# Patient Record
Sex: Female | Born: 1985
Health system: Southern US, Community
[De-identification: ages and names within clinical notes are randomized; demographics above are authoritative.]

## PROBLEM LIST (undated history)

## (undated) DIAGNOSIS — O26649 Intrahepatic cholestasis of pregnancy, unspecified trimester: Secondary | ICD-10-CM

## (undated) DIAGNOSIS — B179 Acute viral hepatitis, unspecified: Secondary | ICD-10-CM

## (undated) DIAGNOSIS — O24419 Gestational diabetes mellitus in pregnancy, unspecified control: Secondary | ICD-10-CM

## (undated) DIAGNOSIS — R945 Abnormal results of liver function studies: Secondary | ICD-10-CM

## (undated) DIAGNOSIS — K831 Obstruction of bile duct: Secondary | ICD-10-CM

## (undated) DIAGNOSIS — S61219A Laceration without foreign body of unspecified finger without damage to nail, initial encounter: Secondary | ICD-10-CM

## (undated) DIAGNOSIS — R7989 Other specified abnormal findings of blood chemistry: Secondary | ICD-10-CM

## (undated) HISTORY — DX: Acute viral hepatitis, unspecified: B17.9

## (undated) HISTORY — DX: Obstruction of bile duct: K83.1

## (undated) HISTORY — DX: Abnormal results of liver function studies: R94.5

## (undated) HISTORY — DX: Gestational diabetes mellitus in pregnancy, unspecified control: O24.419

## (undated) HISTORY — DX: Other specified abnormal findings of blood chemistry: R79.89

## (undated) HISTORY — PX: WISDOM TOOTH EXTRACTION: SHX21

## (undated) HISTORY — DX: Intrahepatic cholestasis of pregnancy, unspecified trimester: O26.649

---
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
## 2014-10-11 ENCOUNTER — Emergency Department (HOSPITAL_COMMUNITY)
Admission: EM | Admit: 2014-10-11 | Discharge: 2014-10-11 | Disposition: A | Discharge status: Home / Self Care | Payer: 59 | Attending: Emergency Medicine | Admitting: Emergency Medicine

## 2014-10-11 ENCOUNTER — Encounter (HOSPITAL_COMMUNITY): Payer: Self-pay | Admitting: Emergency Medicine

## 2014-10-11 DIAGNOSIS — S61214A Laceration without foreign body of right ring finger without damage to nail, initial encounter: Secondary | ICD-10-CM | POA: Insufficient documentation

## 2014-10-11 DIAGNOSIS — S66326A Laceration of extensor muscle, fascia and tendon of right little finger at wrist and hand level, initial encounter: Secondary | ICD-10-CM | POA: Diagnosis not present

## 2014-10-11 DIAGNOSIS — Z23 Encounter for immunization: Secondary | ICD-10-CM | POA: Insufficient documentation

## 2014-10-11 DIAGNOSIS — W260XXA Contact with knife, initial encounter: Secondary | ICD-10-CM | POA: Insufficient documentation

## 2014-10-11 DIAGNOSIS — Y9389 Activity, other specified: Secondary | ICD-10-CM | POA: Diagnosis not present

## 2014-10-11 DIAGNOSIS — S61219A Laceration without foreign body of unspecified finger without damage to nail, initial encounter: Secondary | ICD-10-CM

## 2014-10-11 DIAGNOSIS — Y9289 Other specified places as the place of occurrence of the external cause: Secondary | ICD-10-CM | POA: Insufficient documentation

## 2014-10-11 HISTORY — DX: Laceration without foreign body of unspecified finger without damage to nail, initial encounter: S61.219A

## 2014-10-11 MED ORDER — TETANUS-DIPHTH-ACELL PERTUSSIS 5-2.5-18.5 LF-MCG/0.5 IM SUSP
0.5000 mL | Freq: Once | INTRAMUSCULAR | Status: AC
  Administered 2014-10-11: 0.5 mL via INTRAMUSCULAR
  Filled 2014-10-11: qty 0.5

## 2014-10-11 MED ORDER — LIDOCAINE HCL 2 % IJ SOLN: 10.0000 mL | Freq: Once | INTRAMUSCULAR | Status: DC

## 2014-10-11 MED ORDER — LIDOCAINE HCL 2 % IJ SOLN
20.0000 mL | Freq: Once | INTRAMUSCULAR | Status: AC
  Administered 2014-10-11: 400 mg
  Filled 2014-10-11: qty 20

## 2014-10-11 MED ORDER — CEPHALEXIN 500 MG PO CAPS: 500.0000 mg | ORAL_CAPSULE | Freq: Four times a day (QID) | ORAL | Status: DC

## 2014-10-11 NOTE — ED Provider Notes (Signed)
CSN: [id number]     Arrival date & time 10/11/14  1950 History   First MD Initiated Contact with Patient 10/11/14 2033     Chief Complaint  Patient presents with  . Extremity Laceration     (Consider location/radiation/quality/duration/timing/severity/associated sxs/prior Treatment) The history is provided by the patient.    Right handed patient with laceration to 4th and 5th digits, presented for concern for tendon laceration.  She is a physician, internal medicine.  States she was carving a pumpkin with a kitchen knife when she accidentally cut her fingers.  This was not a stab wound.  No concern for foreign body.  No break in the blade.  She is unable to flex at her DIP on 5th finger, otherwise denies weakness or problems with ROM.  Denies change in sensation.  Denies other injury.   History reviewed. No pertinent past medical history. History reviewed. No pertinent past surgical history. No family history on file. History  Substance Use Topics  . Smoking status: Never Smoker   . Smokeless tobacco: Not on file  . Alcohol Use: No   OB History   Grav Para Term Preterm Abortions TAB SAB Ect Mult Living                 Review of Systems  Skin: Positive for wound. Negative for color change.  Allergic/Immunologic: Negative for immunocompromised state.  Neurological: Positive for weakness. Negative for numbness.  Hematological: Does not bruise/bleed easily.  Psychiatric/Behavioral: Positive for self-injury (accidental).      Allergies  Review of patient's allergies indicates no known allergies.  Home Medications   Prior to Admission medications   Not on File   BP 134/83  Temp(Src) 98.1 F (36.7 C) (Oral)  Resp 16  Ht 5\' 1"  (1.549 m)  Wt 125 lb (56.7 kg)  BMI 23.63 kg/m2  SpO2 100%  LMP 10/04/2014 Physical Exam  Nursing note and vitals reviewed. Constitutional: She appears well-developed and well-nourished. No distress.  HENT:  Head: Normocephalic and  atraumatic.  Neck: Neck supple.  Pulmonary/Chest: Effort normal.  Musculoskeletal:  Laceration to palmar aspect of 4th and 5th fingers over proximal phalanx.  4th finger laceration is superficial.  5th finger laceration is deeper.  Pt unable to flex and DIP, otherwise normal ROM.  Sensation intact. Capillary refill < 2 seconds.    Neurological: She is alert.  Skin: She is not diaphoretic.    ED Course  Procedures (including critical care time) Labs Review Labs Reviewed - No data to display  Imaging Review No results found.   EKG Interpretation None      8:54 PM Discussed pt with Dr Fredna Dow.  Requests wash out, laceration repair, dorsal splint, f/u in his office tomorrow or early next week.   \ LACERATION REPAIR Performed by: Clayton Bibles Authorized by: Clayton Bibles Consent: Verbal consent obtained. Risks and benefits: risks, benefits and alternatives were discussed Consent given by: patient Patient identity confirmed: provided demographic data Prepped and Draped in normal sterile fashion Wound explored  Laceration Location: 5th finger, right hand, palmar aspect  Laceration Length: 1.2cm  No Foreign Bodies seen or palpated  Anesthesia: digital block  Local anesthetic: lidocaine 2% no epinephrine  Anesthetic total: 4 ml  Irrigation method: syringe Amount of cleaning: standard  Skin closure: 5-0 vicryl plus  Number of sutures: 3  Technique: simple interrupted  Patient tolerance: Patient tolerated the procedure well with no immediate complications.   LACERATION REPAIR Performed by: Clayton Bibles Authorized by: Melina Modena,  Nate Perri Consent: Verbal consent obtained. Risks and benefits: risks, benefits and alternatives were discussed Consent given by: patient Patient identity confirmed: provided demographic data Prepped and Draped in normal sterile fashion Wound explored  Laceration Location: 4th finger, right hand, palmar aspect  Laceration Length: 1cm  No Foreign  Bodies seen or palpated  Anesthesia: local infiltration  Local anesthetic: lidocaine 2% no epinephrine  Anesthetic total: 1 ml  Irrigation method: syringe Amount of cleaning: standard  Skin closure: 5-0 vicryl plus  Number of sutures: 2  Technique: simple interrupted.   Patient tolerance: Patient tolerated the procedure well with no immediate complications.   MDM   Final diagnoses:  Laceration of finger with tendon involvement, initial encounter  Finger laceration, initial encounter    Afebrile, nontoxic patient with lacerations to palmar aspect of 4th and 5th fingers, tendon involvement with 5th finger.  Otherwise uncomplicated.  Sensation intact, cap refill is normal.  Discussed with Dr Fredna Dow.  Wound thoroughly cleansed and sutured in ED.  Dorsal splint per Dr Levell July request.    D/C home with keflex, hand follow up.   Discussed result, findings, treatment, and follow up  with patient.  Pt given return precautions.  Pt verbalizes understanding and agrees with plan.         Clayton Bibles, PA-C 10/11/14 2143

## 2014-10-11 NOTE — Discharge Instructions (Signed)
Read the information below.  Use the prescribed medication as directed.  Please discuss all new medications with your pharmacist.  You may return to the Emergency Department at any time for worsening condition or any new symptoms that concern you.  If you develop redness, swelling, pus draining from the wound, or fevers greater than 100.4, return to the ER immediately for a recheck.     Cast or Splint Care Casts and splints support injured limbs and keep bones from moving while they heal. It is important to care for your cast or splint at home.  HOME CARE INSTRUCTIONS  Keep the cast or splint uncovered during the drying period. It can take 24 to 48 hours to dry if it is made of plaster. A fiberglass cast will dry in less than 1 hour.  Do not rest the cast on anything harder than a pillow for the first 24 hours.  Do not put weight on your injured limb or apply pressure to the cast until your health care provider gives you permission.  Keep the cast or splint dry. Wet casts or splints can lose their shape and may not support the limb as well. A wet cast that has lost its shape can also create harmful pressure on your skin when it dries. Also, wet skin can become infected.  Cover the cast or splint with a plastic bag when bathing or when out in the rain or snow. If the cast is on the trunk of the body, take sponge baths until the cast is removed.  If your cast does become wet, dry it with a towel or a blow dryer on the cool setting only.  Keep your cast or splint clean. Soiled casts may be wiped with a moistened cloth.  Do not place any hard or soft foreign objects under your cast or splint, such as cotton, toilet paper, lotion, or powder.  Do not try to scratch the skin under the cast with any object. The object could get stuck inside the cast. Also, scratching could lead to an infection. If itching is a problem, use a blow dryer on a cool setting to relieve discomfort.  Do not trim or cut  your cast or remove padding from inside of it.  Exercise all joints next to the injury that are not immobilized by the cast or splint. For example, if you have a long leg cast, exercise the hip joint and toes. If you have an arm cast or splint, exercise the shoulder, elbow, thumb, and fingers.  Elevate your injured arm or leg on 1 or 2 pillows for the first 1 to 3 days to decrease swelling and pain.It is best if you can comfortably elevate your cast so it is higher than your heart. SEEK MEDICAL CARE IF:   Your cast or splint cracks.  Your cast or splint is too tight or too loose.  You have unbearable itching inside the cast.  Your cast becomes wet or develops a soft spot or area.  You have a bad smell coming from inside your cast.  You get an object stuck under your cast.  Your skin around the cast becomes red or raw.  You have new pain or worsening pain after the cast has been applied. SEEK IMMEDIATE MEDICAL CARE IF:   You have fluid leaking through the cast.  You are unable to move your fingers or toes.  You have discolored (blue or white), cool, painful, or very swollen fingers or toes beyond the  cast.  You have tingling or numbness around the injured area.  You have severe pain or pressure under the cast.  You have any difficulty with your breathing or have shortness of breath.  You have chest pain. Document Released: 11/27/2000 Document Revised: 09/20/2013 Document Reviewed: 06/08/2013 [facility] Patient Information 2015 [place], [place]. This information is not intended to replace advice given to you by your health care provider. Make sure you discuss any questions you have with your health care provider.  Laceration Care, Adult A laceration is a cut or lesion that goes through all layers of the skin and into the tissue just beneath the skin. TREATMENT  Some lacerations may not require closure. Some lacerations may not be able to be closed due to an increased risk of  infection. It is important to see your caregiver as soon as possible after an injury to minimize the risk of infection and maximize the opportunity for successful closure. If closure is appropriate, pain medicines may be given, if needed. The wound will be cleaned to help prevent infection. Your caregiver will use stitches (sutures), staples, wound glue (adhesive), or skin adhesive strips to repair the laceration. These tools bring the skin edges together to allow for faster healing and a better cosmetic outcome. However, all wounds will heal with a scar. Once the wound has healed, scarring can be minimized by covering the wound with sunscreen during the day for 1 full year. HOME CARE INSTRUCTIONS  For sutures or staples:  Keep the wound clean and dry.  If you were given a bandage (dressing), you should change it at least once a day. Also, change the dressing if it becomes wet or dirty, or as directed by your caregiver.  Wash the wound with soap and water 2 times a day. Rinse the wound off with water to remove all soap. Pat the wound dry with a clean towel.  After cleaning, apply a thin layer of the antibiotic ointment as recommended by your caregiver. This will help prevent infection and keep the dressing from sticking.  You may shower as usual after the first 24 hours. Do not soak the wound in water until the sutures are removed.  Only take over-the-counter or prescription medicines for pain, discomfort, or fever as directed by your caregiver.  Get your sutures or staples removed as directed by your caregiver. For skin adhesive strips:  Keep the wound clean and dry.  Do not get the skin adhesive strips wet. You may bathe carefully, using caution to keep the wound dry.  If the wound gets wet, pat it dry with a clean towel.  Skin adhesive strips will fall off on their own. You may trim the strips as the wound heals. Do not remove skin adhesive strips that are still stuck to the wound. They  will fall off in time. For wound adhesive:  You may briefly wet your wound in the shower or bath. Do not soak or scrub the wound. Do not swim. Avoid periods of heavy perspiration until the skin adhesive has fallen off on its own. After showering or bathing, gently pat the wound dry with a clean towel.  Do not apply liquid medicine, cream medicine, or ointment medicine to your wound while the skin adhesive is in place. This may loosen the film before your wound is healed.  If a dressing is placed over the wound, be careful not to apply tape directly over the skin adhesive. This may cause the adhesive to be pulled off  before the wound is healed.  Avoid prolonged exposure to sunlight or tanning lamps while the skin adhesive is in place. Exposure to ultraviolet light in the first year will darken the scar.  The skin adhesive will usually remain in place for 5 to 10 days, then naturally fall off the skin. Do not pick at the adhesive film. You may need a tetanus shot if:  You cannot remember when you had your last tetanus shot.  You have never had a tetanus shot. If you get a tetanus shot, your arm may swell, get red, and feel warm to the touch. This is common and not a problem. If you need a tetanus shot and you choose not to have one, there is a rare chance of getting tetanus. Sickness from tetanus can be serious. SEEK MEDICAL CARE IF:   You have redness, swelling, or increasing pain in the wound.  You see a red line that goes away from the wound.  You have yellowish-white fluid (pus) coming from the wound.  You have a fever.  You notice a bad smell coming from the wound or dressing.  Your wound breaks open before or after sutures have been removed.  You notice something coming out of the wound such as wood or glass.  Your wound is on your hand or foot and you cannot move a finger or toe. SEEK IMMEDIATE MEDICAL CARE IF:   Your pain is not controlled with prescribed medicine.  You  have severe swelling around the wound causing pain and numbness or a change in color in your arm, hand, leg, or foot.  Your wound splits open and starts bleeding.  You have worsening numbness, weakness, or loss of function of any joint around or beyond the wound.  You develop painful lumps near the wound or on the skin anywhere on your body. MAKE SURE YOU:   Understand these instructions.  Will watch your condition.  Will get help right away if you are not doing well or get worse. Document Released: 11/30/2005 Document Revised: 02/22/2012 Document Reviewed: 05/26/2011 [facility] [facility] Patient Information 2015 [place], [place]. This information is not intended to replace advice given to you by your health care provider. Make sure you discuss any questions you have with your health care provider.

## 2014-10-11 NOTE — ED Provider Notes (Signed)
Medical screening examination/treatment/procedure(s) were performed by non-physician practitioner and as supervising physician I was immediately available for consultation/collaboration.   EKG Interpretation None        Artis Delay, MD 10/11/14 2342

## 2014-10-11 NOTE — ED Notes (Signed)
Pt presents with c/o finger laceration. Pt reports she accidentally cut her right ring finger and right pinky finger with a knife. Bleeding controlled at this time.

## 2014-10-12 ENCOUNTER — Encounter (HOSPITAL_BASED_OUTPATIENT_CLINIC_OR_DEPARTMENT_OTHER): Payer: Self-pay | Admitting: *Deleted

## 2014-10-12 ENCOUNTER — Other Ambulatory Visit: Payer: Self-pay | Admitting: Orthopedic Surgery

## 2014-10-15 ENCOUNTER — Ambulatory Visit (HOSPITAL_BASED_OUTPATIENT_CLINIC_OR_DEPARTMENT_OTHER): Payer: 59 | Admitting: Anesthesiology

## 2014-10-15 ENCOUNTER — Ambulatory Visit (HOSPITAL_BASED_OUTPATIENT_CLINIC_OR_DEPARTMENT_OTHER)
Admission: RE | Admit: 2014-10-15 | Discharge: 2014-10-15 | Disposition: A | Discharge status: Home / Self Care | Payer: 59 | Source: Ambulatory Visit | Attending: Orthopedic Surgery | Admitting: Orthopedic Surgery

## 2014-10-15 ENCOUNTER — Encounter (HOSPITAL_BASED_OUTPATIENT_CLINIC_OR_DEPARTMENT_OTHER)
Admission: RE | Disposition: A | Discharge status: Home / Self Care | Payer: Self-pay | Source: Ambulatory Visit | Attending: Orthopedic Surgery

## 2014-10-15 ENCOUNTER — Encounter (HOSPITAL_BASED_OUTPATIENT_CLINIC_OR_DEPARTMENT_OTHER): Payer: Self-pay

## 2014-10-15 DIAGNOSIS — S61216A Laceration without foreign body of right little finger without damage to nail, initial encounter: Secondary | ICD-10-CM | POA: Insufficient documentation

## 2014-10-15 DIAGNOSIS — Y9389 Activity, other specified: Secondary | ICD-10-CM | POA: Diagnosis not present

## 2014-10-15 DIAGNOSIS — Y998 Other external cause status: Secondary | ICD-10-CM | POA: Diagnosis not present

## 2014-10-15 DIAGNOSIS — Y92099 Unspecified place in other non-institutional residence as the place of occurrence of the external cause: Secondary | ICD-10-CM | POA: Insufficient documentation

## 2014-10-15 DIAGNOSIS — W260XXA Contact with knife, initial encounter: Secondary | ICD-10-CM | POA: Diagnosis not present

## 2014-10-15 DIAGNOSIS — S61214A Laceration without foreign body of right ring finger without damage to nail, initial encounter: Secondary | ICD-10-CM | POA: Diagnosis present

## 2014-10-15 DIAGNOSIS — S56127A Laceration of flexor muscle, fascia and tendon of right little finger at forearm level, initial encounter: Secondary | ICD-10-CM | POA: Insufficient documentation

## 2014-10-15 HISTORY — PX: WOUND EXPLORATION: SHX6188

## 2014-10-15 HISTORY — PX: TENDON REPAIR: SHX5111

## 2014-10-15 HISTORY — DX: Laceration without foreign body of unspecified finger without damage to nail, initial encounter: S61.219A

## 2014-10-15 LAB — POCT HEMOGLOBIN-HEMACUE: HEMOGLOBIN: 14.9 g/dL (ref 12.0–15.0)

## 2014-10-15 SURGERY — WOUND EXPLORATION
Anesthesia: General | Site: Finger | Laterality: Right

## 2014-10-15 MED ORDER — LIDOCAINE HCL (CARDIAC) 20 MG/ML IV SOLN
INTRAVENOUS | Status: DC | PRN
  Administered 2014-10-15: 60 mg via INTRAVENOUS

## 2014-10-15 MED ORDER — DEXAMETHASONE SODIUM PHOSPHATE 10 MG/ML IJ SOLN
INTRAMUSCULAR | Status: DC | PRN
  Administered 2014-10-15: 10 mg via INTRAVENOUS

## 2014-10-15 MED ORDER — SCOPOLAMINE 1 MG/3DAYS TD PT72
1.0000 | MEDICATED_PATCH | TRANSDERMAL | Status: DC
  Administered 2014-10-15: 1.5 mg via TRANSDERMAL

## 2014-10-15 MED ORDER — OXYCODONE HCL 5 MG/5ML PO SOLN: 5.0000 mg | Freq: Once | ORAL | Status: DC | PRN

## 2014-10-15 MED ORDER — FENTANYL CITRATE 0.05 MG/ML IJ SOLN
INTRAMUSCULAR | Status: AC
Start: 2014-10-15 — End: 2014-10-15
  Filled 2014-10-15: qty 6

## 2014-10-15 MED ORDER — BUPIVACAINE HCL (PF) 0.25 % IJ SOLN
INTRAMUSCULAR | Status: DC | PRN
  Administered 2014-10-15: 8 mL

## 2014-10-15 MED ORDER — CHLORHEXIDINE GLUCONATE 4 % EX LIQD: 60.0000 mL | Freq: Once | CUTANEOUS | Status: DC

## 2014-10-15 MED ORDER — ONDANSETRON HCL 4 MG/2ML IJ SOLN: 4.0000 mg | Freq: Once | INTRAMUSCULAR | Status: DC | PRN

## 2014-10-15 MED ORDER — FENTANYL CITRATE 0.05 MG/ML IJ SOLN: 50.0000 ug | INTRAMUSCULAR | Status: DC | PRN

## 2014-10-15 MED ORDER — BUPIVACAINE HCL (PF) 0.25 % IJ SOLN
INTRAMUSCULAR | Status: AC
  Filled 2014-10-15: qty 30

## 2014-10-15 MED ORDER — MIDAZOLAM HCL 2 MG/2ML IJ SOLN
INTRAMUSCULAR | Status: AC
  Filled 2014-10-15: qty 2

## 2014-10-15 MED ORDER — EPHEDRINE SULFATE 50 MG/ML IJ SOLN
INTRAMUSCULAR | Status: DC | PRN
  Administered 2014-10-15: 10 mg via INTRAVENOUS

## 2014-10-15 MED ORDER — LACTATED RINGERS IV SOLN
INTRAVENOUS | Status: DC
  Administered 2014-10-15 (×2): via INTRAVENOUS

## 2014-10-15 MED ORDER — SUCCINYLCHOLINE CHLORIDE 20 MG/ML IJ SOLN
INTRAMUSCULAR | Status: AC
  Filled 2014-10-15: qty 1

## 2014-10-15 MED ORDER — MIDAZOLAM HCL 5 MG/5ML IJ SOLN
INTRAMUSCULAR | Status: DC | PRN
  Administered 2014-10-15: 2 mg via INTRAVENOUS

## 2014-10-15 MED ORDER — SCOPOLAMINE 1 MG/3DAYS TD PT72
MEDICATED_PATCH | TRANSDERMAL | Status: AC
  Filled 2014-10-15: qty 1

## 2014-10-15 MED ORDER — PROPOFOL 10 MG/ML IV EMUL
INTRAVENOUS | Status: AC
  Filled 2014-10-15: qty 50

## 2014-10-15 MED ORDER — MIDAZOLAM HCL 2 MG/ML PO SYRP: 12.0000 mg | ORAL_SOLUTION | Freq: Once | ORAL | Status: DC | PRN

## 2014-10-15 MED ORDER — CEFAZOLIN SODIUM-DEXTROSE 2-3 GM-% IV SOLR
2.0000 g | INTRAVENOUS | Status: AC
  Administered 2014-10-15: 2 g via INTRAVENOUS

## 2014-10-15 MED ORDER — ONDANSETRON HCL 4 MG/2ML IJ SOLN
INTRAMUSCULAR | Status: DC | PRN
  Administered 2014-10-15: 4 mg via INTRAVENOUS

## 2014-10-15 MED ORDER — OXYCODONE HCL 5 MG PO TABS
5.0000 mg | ORAL_TABLET | Freq: Once | ORAL | Status: DC | PRN
Start: 2014-10-15 — End: 2014-10-15

## 2014-10-15 MED ORDER — HYDROCODONE-ACETAMINOPHEN 5-325 MG PO TABS: ORAL_TABLET | ORAL | Status: DC

## 2014-10-15 MED ORDER — PROPOFOL 10 MG/ML IV BOLUS
INTRAVENOUS | Status: DC | PRN
  Administered 2014-10-15: 150 mg via INTRAVENOUS

## 2014-10-15 MED ORDER — FENTANYL CITRATE 0.05 MG/ML IJ SOLN
INTRAMUSCULAR | Status: DC | PRN
  Administered 2014-10-15: 50 ug via INTRAVENOUS
  Administered 2014-10-15: 25 ug via INTRAVENOUS
  Administered 2014-10-15: 50 ug via INTRAVENOUS
  Administered 2014-10-15: 25 ug via INTRAVENOUS

## 2014-10-15 MED ORDER — HYDROMORPHONE HCL 1 MG/ML IJ SOLN: 0.2500 mg | INTRAMUSCULAR | Status: DC | PRN

## 2014-10-15 MED ORDER — CEFAZOLIN SODIUM-DEXTROSE 2-3 GM-% IV SOLR
INTRAVENOUS | Status: AC
Start: 2014-10-15 — End: 2014-10-15
  Filled 2014-10-15: qty 50

## 2014-10-15 MED ORDER — MIDAZOLAM HCL 2 MG/2ML IJ SOLN: 1.0000 mg | INTRAMUSCULAR | Status: DC | PRN

## 2014-10-15 SURGICAL SUPPLY — 95 items
BAG DECANTER FOR FLEXI CONT (MISCELLANEOUS) IMPLANT
BANDAGE ELASTIC 3 VELCRO ST LF (GAUZE/BANDAGES/DRESSINGS) ×3 IMPLANT
BLADE MINI RND TIP GREEN BEAV (BLADE) IMPLANT
BLADE SURG 15 STRL LF DISP TIS (BLADE) ×2 IMPLANT
BLADE SURG 15 STRL SS (BLADE) ×4
BNDG CONFORM 2 STRL LF (GAUZE/BANDAGES/DRESSINGS) IMPLANT
BNDG ELASTIC 2 VLCR STRL LF (GAUZE/BANDAGES/DRESSINGS) IMPLANT
BNDG ESMARK 4X9 LF (GAUZE/BANDAGES/DRESSINGS) ×3 IMPLANT
BNDG GAUZE ELAST 4 BULKY (GAUZE/BANDAGES/DRESSINGS) ×3 IMPLANT
CHLORAPREP W/TINT 26ML (MISCELLANEOUS) ×3 IMPLANT
CORDS BIPOLAR (ELECTRODE) ×3 IMPLANT
COTTONBALL LRG STERILE PKG (GAUZE/BANDAGES/DRESSINGS) IMPLANT
COVER BACK TABLE 60X90IN (DRAPES) ×3 IMPLANT
COVER MAYO STAND STRL (DRAPES) ×3 IMPLANT
CUFF TOURNIQUET SINGLE 18IN (TOURNIQUET CUFF) ×3 IMPLANT
DECANTER SPIKE VIAL GLASS SM (MISCELLANEOUS) IMPLANT
DRAIN TLS ROUND 10FR (DRAIN) IMPLANT
DRAPE EXTREMITY T 121X128X90 (DRAPE) ×3 IMPLANT
DRAPE OEC MINIVIEW 54X84 (DRAPES) IMPLANT
DRAPE SURG 17X23 STRL (DRAPES) ×3 IMPLANT
DRSG PAD ABDOMINAL 8X10 ST (GAUZE/BANDAGES/DRESSINGS) IMPLANT
GAUZE SPONGE 4X4 12PLY STRL (GAUZE/BANDAGES/DRESSINGS) ×3 IMPLANT
GAUZE SPONGE 4X4 16PLY XRAY LF (GAUZE/BANDAGES/DRESSINGS) IMPLANT
GAUZE XEROFORM 1X8 LF (GAUZE/BANDAGES/DRESSINGS) ×3 IMPLANT
GLOVE BIO SURGEON STRL SZ 6.5 (GLOVE) ×2 IMPLANT
GLOVE BIO SURGEON STRL SZ7.5 (GLOVE) ×3 IMPLANT
GLOVE BIO SURGEONS STRL SZ 6.5 (GLOVE) ×1
GLOVE BIOGEL PI IND STRL 7.0 (GLOVE) ×1 IMPLANT
GLOVE BIOGEL PI IND STRL 8 (GLOVE) ×1 IMPLANT
GLOVE BIOGEL PI IND STRL 8.5 (GLOVE) ×1 IMPLANT
GLOVE BIOGEL PI INDICATOR 7.0 (GLOVE) ×2
GLOVE BIOGEL PI INDICATOR 8 (GLOVE) ×2
GLOVE BIOGEL PI INDICATOR 8.5 (GLOVE) ×2
GLOVE SURG ORTHO 8.0 STRL STRW (GLOVE) ×3 IMPLANT
GOWN STRL REUS W/ TWL LRG LVL3 (GOWN DISPOSABLE) ×1 IMPLANT
GOWN STRL REUS W/ TWL XL LVL3 (GOWN DISPOSABLE) IMPLANT
GOWN STRL REUS W/TWL LRG LVL3 (GOWN DISPOSABLE) ×2
GOWN STRL REUS W/TWL XL LVL3 (GOWN DISPOSABLE) ×6 IMPLANT
K-WIRE .035X4 (WIRE) IMPLANT
LOOP VESSEL MAXI BLUE (MISCELLANEOUS) IMPLANT
NDL SAFETY ECLIPSE 18X1.5 (NEEDLE) IMPLANT
NEEDLE HYPO 18GX1.5 SHARP (NEEDLE)
NEEDLE HYPO 22GX1.5 SAFETY (NEEDLE) IMPLANT
NEEDLE HYPO 25X1 1.5 SAFETY (NEEDLE) ×3 IMPLANT
NEEDLE KEITH (NEEDLE) IMPLANT
NS IRRIG 1000ML POUR BTL (IV SOLUTION) ×3 IMPLANT
PACK BASIN DAY SURGERY FS (CUSTOM PROCEDURE TRAY) ×3 IMPLANT
PAD CAST 3X4 CTTN HI CHSV (CAST SUPPLIES) IMPLANT
PAD CAST 4YDX4 CTTN HI CHSV (CAST SUPPLIES) ×1 IMPLANT
PADDING CAST ABS 3INX4YD NS (CAST SUPPLIES)
PADDING CAST ABS 4INX4YD NS (CAST SUPPLIES) ×2
PADDING CAST ABS COTTON 3X4 (CAST SUPPLIES) IMPLANT
PADDING CAST ABS COTTON 4X4 ST (CAST SUPPLIES) ×1 IMPLANT
PADDING CAST COTTON 3X4 STRL (CAST SUPPLIES)
PADDING CAST COTTON 4X4 STRL (CAST SUPPLIES) ×2
SLEEVE SCD COMPRESS KNEE MED (MISCELLANEOUS) IMPLANT
SPEAR EYE SURG WECK-CEL (MISCELLANEOUS) ×3 IMPLANT
SPLINT PLASTER CAST XFAST 3X15 (CAST SUPPLIES) ×14 IMPLANT
SPLINT PLASTER CAST XFAST 4X15 (CAST SUPPLIES) IMPLANT
SPLINT PLASTER XTRA FAST SET 4 (CAST SUPPLIES)
SPLINT PLASTER XTRA FASTSET 3X (CAST SUPPLIES) ×28
STOCKINETTE 4X48 STRL (DRAPES) ×3 IMPLANT
SUT CHROMIC 5 0 P 3 (SUTURE) IMPLANT
SUT ETHIBOND 3-0 V-5 (SUTURE) IMPLANT
SUT ETHILON 3 0 PS 1 (SUTURE) IMPLANT
SUT ETHILON 4 0 PS 2 18 (SUTURE) IMPLANT
SUT FIBERWIRE 3-0 18 TAPR NDL (SUTURE)
SUT FIBERWIRE 4-0 18 DIAM BLUE (SUTURE)
SUT FIBERWIRE 4-0 18 TAPR NDL (SUTURE) ×3
SUT MERSILENE 2.0 SH NDLE (SUTURE) IMPLANT
SUT MERSILENE 3 0 FS 1 (SUTURE) IMPLANT
SUT MERSILENE 4 0 P 3 (SUTURE) IMPLANT
SUT MERSILENE 6 0 P 1 (SUTURE) IMPLANT
SUT MON AB 5-0 PS2 18 (SUTURE) ×3 IMPLANT
SUT NYLON 9 0 VRM6 (SUTURE) IMPLANT
SUT POLY BUTTON 15MM (SUTURE) IMPLANT
SUT PROLENE 2 0 SH DA (SUTURE) IMPLANT
SUT PROLENE 6 0 P 1 18 (SUTURE) ×3 IMPLANT
SUT SILK 2 0 FS (SUTURE) IMPLANT
SUT SILK 4 0 PS 2 (SUTURE) ×3 IMPLANT
SUT STEEL 4 0 V 26 (SUTURE) IMPLANT
SUT SUPRAMID 4-0 (SUTURE) IMPLANT
SUT VIC AB 3-0 PS1 18 (SUTURE)
SUT VIC AB 3-0 PS1 18XBRD (SUTURE) IMPLANT
SUT VIC AB 4-0 P-3 18XBRD (SUTURE) IMPLANT
SUT VIC AB 4-0 P3 18 (SUTURE)
SUT VICRYL 4-0 PS2 18IN ABS (SUTURE) IMPLANT
SUTURE FIBERWR 3-0 18 TAPR NDL (SUTURE) IMPLANT
SUTURE FIBERWR 4-0 18 DIA BLUE (SUTURE) IMPLANT
SUTURE FIBERWR 4-0 18 TAPR NDL (SUTURE) ×1 IMPLANT
SYR BULB 3OZ (MISCELLANEOUS) ×3 IMPLANT
SYR CONTROL 10ML LL (SYRINGE) ×3 IMPLANT
TOWEL OR 17X24 6PK STRL BLUE (TOWEL DISPOSABLE) ×6 IMPLANT
TUBE FEEDING 5FR 15 INCH (TUBING) IMPLANT
UNDERPAD 30X30 INCONTINENT (UNDERPADS AND DIAPERS) ×3 IMPLANT

## 2014-10-15 NOTE — Discharge Instructions (Addendum)

## 2014-10-15 NOTE — Anesthesia Procedure Notes (Signed)
Procedure Name: LMA Insertion Performed by: Female Minish Pre-anesthesia Checklist: Patient identified, Emergency Drugs available, Suction available and Patient being monitored Patient Re-evaluated:Patient Re-evaluated prior to inductionOxygen Delivery Method: Circle System Utilized Preoxygenation: Pre-oxygenation with 100% oxygen Intubation Type: IV induction Ventilation: Mask ventilation without difficulty LMA: LMA inserted LMA Size: 4.0 Number of attempts: 1 Airway Equipment and Method: bite block Placement Confirmation: positive ETCO2 Tube secured with: Tape Dental Injury: Teeth and Oropharynx as per pre-operative assessment

## 2014-10-15 NOTE — Op Note (Signed)
[id number] 

## 2014-10-15 NOTE — Anesthesia Postprocedure Evaluation (Signed)
  Anesthesia Post-op Note  Patient: Kathy Blackwell  Procedure(s) Performed: Procedure(s): RIGHT RING WOUND EXPLORATION (Right) RIGHT SMALL TENDON REPAIR (Right)  Patient Location: PACU  Anesthesia Type: General   Level of Consciousness: awake, alert  and oriented  Airway and Oxygen Therapy: Patient Spontanous Breathing  Post-op Pain: mild  Post-op Assessment: Post-op Vital signs reviewed  Post-op Vital Signs: Reviewed  Last Vitals:  Filed Vitals:   10/15/14 1430  BP: 120/80  Pulse: 95  Temp:   Resp: 15    Complications: No apparent anesthesia complications

## 2014-10-15 NOTE — Anesthesia Preprocedure Evaluation (Signed)
Anesthesia Evaluation  Patient identified by MRN, date of birth, ID band Patient awake    Reviewed: Allergy & Precautions, H&P , NPO status , Patient's Chart, lab work & pertinent test results  Airway Mallampati: I  TM Distance: >3 FB Neck ROM: Full    Dental  (+) Teeth Intact, Dental Advisory Given   Pulmonary  breath sounds clear to auscultation        Cardiovascular Rhythm:Regular Rate:Normal     Neuro/Psych    GI/Hepatic   Endo/Other    Renal/GU      Musculoskeletal   Abdominal   Peds  Hematology   Anesthesia Other Findings   Reproductive/Obstetrics                             Anesthesia Physical Anesthesia Plan  ASA: I  Anesthesia Plan: General   Post-op Pain Management:    Induction: Intravenous  Airway Management Planned:   Additional Equipment:   Intra-op Plan:   Post-operative Plan: Extubation in OR  Informed Consent: I have reviewed the patients History and Physical, chart, labs and discussed the procedure including the risks, benefits and alternatives for the proposed anesthesia with the patient or authorized representative who has indicated his/her understanding and acceptance.   Dental advisory given  Plan Discussed with: CRNA, Anesthesiologist and Surgeon  Anesthesia Plan Comments:         Anesthesia Quick Evaluation

## 2014-10-15 NOTE — Transfer of Care (Signed)
Immediate Anesthesia Transfer of Care Note  Patient: Kathy Blackwell  Procedure(s) Performed: Procedure(s): RIGHT RING WOUND EXPLORATION (Right) RIGHT SMALL TENDON REPAIR (Right)  Patient Location: PACU  Anesthesia Type:General  Level of Consciousness: awake, alert , oriented and patient cooperative  Airway & Oxygen Therapy: Patient Spontanous Breathing and Patient connected to face mask oxygen  Post-op Assessment: Report given to PACU RN and Post -op Vital signs reviewed and stable  Post vital signs: Reviewed and stable  Complications: No apparent anesthesia complications

## 2014-10-15 NOTE — H&P (Signed)
  Kathy Blackwell is an 28 y.o. female.   Chief Complaint: right ring/small finger lacerations HPI: 28 yo rhd female states she was carving a pumpkin 10/11/14 when knife slipped causing laceration to right ring and small fingers.  Seen at [facility] where wounds I&D'd and sutured.  Reports no previous injury to digits and no other injury at this time.  Past Medical History  Diagnosis Date  . Finger laceration involving tendon 10/11/2014    right ring and small fingers    Past Surgical History  Procedure Laterality Date  . Tonsillectomy and adenoidectomy      History reviewed. No pertinent family history. Social History:  reports that she has never smoked. She has never used smokeless tobacco. She reports that she drinks alcohol. She reports that she does not use illicit drugs.  Allergies: No Known Allergies  Medications Prior to Admission  Medication Sig Dispense Refill  . cephALEXin (KEFLEX) 500 MG capsule Take 1 capsule (500 mg total) by mouth 4 (four) times daily. 20 capsule 0    Results for orders placed or performed during the hospital encounter of 10/15/14 (from the past 48 hour(s))  Hemoglobin-hemacue, POC     Status: None   Collection Time: 10/15/14 11:12 AM  Result Value Ref Range   Hemoglobin 14.9 12.0 - 15.0 g/dL    No results found.   A comprehensive review of systems was negative.  Blood pressure 127/78, pulse 83, temperature 98.1 F (36.7 C), temperature source Oral, resp. rate 16, height 5\' 1"  (1.549 m), weight 57.267 kg (126 lb 4 oz), last menstrual period 10/04/2014, SpO2 100 %.  General appearance: alert, cooperative and appears stated age Head: Normocephalic, without obvious abnormality, atraumatic Neck: supple, symmetrical, trachea midline Resp: clear to auscultation bilaterally Cardio: regular rate and rhythm GI: non tender Extremities: intact sensation and capillary refill all digits.  +epl/fpl/io.  lacerations at base of right ring and small fingers.   unable to flex dip of small finger.  intact flexion of ring. Pulses: 2+ and symmetric Skin: Skin color, texture, turgor normal. No rashes or lesions Neurologic: Grossly normal Incision/Wound: As above  Assessment/Plan Right small and ring finger lacerations.  Non operative and operative treatment options were discussed with the patient and patient wishes to proceed with operative treatment. Recommend operative exploration with repair tendon/artery/nerve as necessary.  Risks, benefits, and alternatives of surgery were discussed and the patient agrees with the plan of care.   Kyheem Bathgate R 10/15/2014, 12:07 PM

## 2014-10-15 NOTE — Brief Op Note (Signed)
10/15/2014  2:06 PM  PATIENT:  Kathy Blackwell  28 y.o. female  PRE-OPERATIVE DIAGNOSIS:  right ring and small lacerations possible tendon  POST-OPERATIVE DIAGNOSIS:  Right Ring and Small Lacerations Hand  PROCEDURE:  Procedure(s): RIGHT RING WOUND EXPLORATION (Right) RIGHT SMALL TENDON REPAIR (Right)  SURGEON:  Surgeon(s) and Role:    * Leanora Cover, MD - Primary  PHYSICIAN ASSISTANT:   ASSISTANTS: Daryll Brod, MD   ANESTHESIA:   general  EBL:  Total I/O In: 1000 [I.V.:1000] Out: -   BLOOD ADMINISTERED:none  DRAINS: none   LOCAL MEDICATIONS USED:  MARCAINE     SPECIMEN:  No Specimen  DISPOSITION OF SPECIMEN:  N/A  COUNTS:  YES  TOURNIQUET:   Total Tourniquet Time Documented: Upper Arm (Right) - 71 minutes Total: Upper Arm (Right) - 71 minutes   DICTATION: .Other Dictation: Dictation Number [id number]  PLAN OF CARE: Discharge to home after PACU  PATIENT DISPOSITION:  PACU - hemodynamically stable.

## 2014-10-16 ENCOUNTER — Encounter (HOSPITAL_BASED_OUTPATIENT_CLINIC_OR_DEPARTMENT_OTHER): Payer: Self-pay | Admitting: Orthopedic Surgery

## 2014-10-16 NOTE — Op Note (Addendum)
NAME:  Kathy Blackwell, Kathy Blackwell NO.:  [id number]  MEDICAL RECORD NO.:  [id number]  LOCATION:                               FACILITY:  [facility]  PHYSICIAN:  Leanora Cover, MD        DATE OF BIRTH:  [birth year]  DATE OF PROCEDURE:  10/15/2014 DATE OF DISCHARGE:  10/15/2014                              OPERATIVE REPORT   PREOPERATIVE DIAGNOSIS:  Right ring and small finger lacerations as well as small finger tendon laceration.  POSTOPERATIVE DIAGNOSIS:  Right ring and small finger lacerations as well as small finger tendon laceration.  PROCEDURE:   1. Right ring finger exploration of wound 2. Right small finger repair of flexor digitorum profundus tendon in zone 2.  SURGEON:  Leanora Cover, MD  ASSISTANT:  Daryll Brod, M.D.  ANESTHESIA:  General.  IV FLUIDS:  Per anesthesia flow sheet.  ESTIMATED BLOOD LOSS:  Minimal.  COMPLICATIONS:  None.  SPECIMENS:  None.  TOURNIQUET TIME:  71 minutes.  DISPOSITION:  Stable to PACU.  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  She was seen at [facility] where the wounds were irrigated, debrided and sutured.  She was followed up in the office.  She was unable to flex the DIP joint of the small finger.  She had intact sensation and capillary refill.  Recommended going to the operating room for exploration of the wounds; repair of tendon, artery, and nerve as necessary.  Risks, benefits and alternatives of the surgery were discussed including the risk of blood loss; infection; damage to nerves, vessels, tendons, ligaments, bone; failure of surgery; need for additional surgery; complications with wound healing; continued pain and stiffness.  She voiced understanding of these risks and elected to proceed.  OPERATIVE COURSE:  After being identified preoperatively by myself, the patient and I agreed upon  the procedure and site of procedure.  The surgical site was marked.  The risks, benefits, and alternatives of the surgery were reviewed and she wished to proceed.  Surgical consent had been signed.  She was given IV Ancef as preoperative antibiotic prophylaxis.  She was transferred to the operating room and placed on the operating room table in supine position with the right upper extremity on an armboard.  General anesthesia was induced by Anesthesiology.  Right upper extremity was prepped and draped in normal sterile orthopedic fashion.  Surgical pause was performed between the surgeons, anesthesia, and operating room staff, and all were in agreement as to the patient, procedure, site of procedure.  Tourniquet at the proximal aspect of the extremity was inflated to 250 mmHg after exsanguination of the limb with an Esmarch bandage.  The ring finger was addressed first.  An incision was extended proximally.  This was carried into subcutaneous tissues by spreading technique.  The radial and ulnar neurovascular bundles were identified and were intact.  The tendon sheath was not violated.  There was no blood within the sheath.  The tendons appeared intact.  The wound was copiously irrigated with sterile saline and closed with 5-0 Monocryl in a horizontal mattress fashion. The  small finger was addressed.  The incision was extended both proximally and distally in a Brunner-type fashion.  It was carried into subcutaneous tissues by spreading technique.  Bipolar electrocautery was used to obtain hemostasis.  The radial and ulnar neurovascular bundles were identified and were intact.  There was laceration of the FDP tendon.  This was just proximal to the A4 pulley.  The area of the laceration in the sheath was at the proximal phalanx.  The wound was copiously irrigated with sterile saline.  The proximal stump of tendon was able to be milked back through the sheath up to the small rent that was  made proximal to the A4 pulley.  A 1-2 mm of the proximal aspect of the A4 pulley was split to aid in repair and for the repair of the tendon.  The tendon was repaired with a 6-0 Prolene suture as a running epitendinous suture and a 4-0 FiberWire as a core suture placed in a modified Kessler fashion.  Two-strand repair was achieved.  There was no further space available for additional strands.  The wound was again copiously irrigated with sterile saline.  The tendon ends were approximated well.  Skin was closed with 5-0 Monocryl in a horizontal mattress fashion.  Digital block to the ring and small fingers was performed with 8 mL of 0.25% plain Marcaine to aid in postoperative analgesia.  The wounds were then dressed with sterile Xeroform, 4x4s, and wrapped with a Kerlix bandage.  Dorsal blocking splint with the wrist and MPs flexed was placed and wrapped with Kerlix and Ace bandage. Tourniquet was deflated at 71 minutes.  Fingertips were pink with brisk capillary refill after deflation of the tourniquet.  Operative drapes were broken down.  The patient was awoken from anesthesia safely.  She was transferred back to the stretcher and taken to PACU in stable condition.  I will see her back in the office in 1 week for postoperative followup.  I will give her Norco 5/325, 1-2 p.o. q.6 hours p.r.n. pain, dispense #40.     Leanora Cover, MD     KK/MEDQ  D:  10/15/2014  T:  10/16/2014  Job:  [id number]  10/22/14: Edit for accuracy of procedure.

## 2015-01-09 ENCOUNTER — Other Ambulatory Visit (HOSPITAL_COMMUNITY): Payer: Self-pay | Admitting: Internal Medicine

## 2015-01-09 DIAGNOSIS — R1011 Right upper quadrant pain: Secondary | ICD-10-CM

## 2015-01-23 ENCOUNTER — Ambulatory Visit (HOSPITAL_COMMUNITY)
Admission: RE | Admit: 2015-01-23 | Discharge: 2015-01-23 | Disposition: A | Discharge status: Home / Self Care | Payer: 59 | Source: Ambulatory Visit | Attending: Internal Medicine | Admitting: Internal Medicine

## 2015-01-23 DIAGNOSIS — R1011 Right upper quadrant pain: Secondary | ICD-10-CM | POA: Diagnosis present

## 2015-01-23 DIAGNOSIS — K8020 Calculus of gallbladder without cholecystitis without obstruction: Secondary | ICD-10-CM | POA: Diagnosis not present

## 2015-02-13 ENCOUNTER — Other Ambulatory Visit (HOSPITAL_COMMUNITY): Payer: Self-pay | Admitting: Gynecology

## 2015-02-13 DIAGNOSIS — Z3141 Encounter for fertility testing: Secondary | ICD-10-CM

## 2015-02-19 ENCOUNTER — Ambulatory Visit (HOSPITAL_COMMUNITY)
Admission: RE | Admit: 2015-02-19 | Discharge: 2015-02-19 | Disposition: A | Discharge status: Home / Self Care | Payer: 59 | Source: Ambulatory Visit | Attending: Gynecology | Admitting: Gynecology

## 2015-02-19 ENCOUNTER — Encounter (INDEPENDENT_AMBULATORY_CARE_PROVIDER_SITE_OTHER): Payer: Self-pay

## 2015-02-19 DIAGNOSIS — Z3141 Encounter for fertility testing: Secondary | ICD-10-CM | POA: Insufficient documentation

## 2015-02-19 MED ORDER — IOHEXOL 300 MG/ML  SOLN
30.0000 mL | Freq: Once | INTRAMUSCULAR | Status: AC | PRN
  Administered 2015-02-19: 30 mL

## 2015-07-15 DIAGNOSIS — B179 Acute viral hepatitis, unspecified: Secondary | ICD-10-CM

## 2015-07-15 HISTORY — DX: Acute viral hepatitis, unspecified: B17.9

## 2015-07-18 ENCOUNTER — Other Ambulatory Visit: Payer: Self-pay

## 2015-07-18 ENCOUNTER — Telehealth: Payer: Self-pay | Admitting: Gastroenterology

## 2015-07-18 DIAGNOSIS — K838 Other specified diseases of biliary tract: Secondary | ICD-10-CM

## 2015-07-18 DIAGNOSIS — K8020 Calculus of gallbladder without cholecystitis without obstruction: Secondary | ICD-10-CM

## 2015-07-18 DIAGNOSIS — K831 Obstruction of bile duct: Secondary | ICD-10-CM

## 2015-07-18 NOTE — Telephone Encounter (Signed)
Dr Jacobs please review  

## 2015-07-18 NOTE — Telephone Encounter (Signed)
Dr Bufford Buttner called and asked that if he was not specifically asked for this should go to the doc of the day.  I called and there is no preference in docs so I will send to the doc of the day.

## 2015-07-19 ENCOUNTER — Ambulatory Visit (HOSPITAL_COMMUNITY): Admit: 2015-07-19 | Payer: Self-pay | Admitting: Gastroenterology

## 2015-07-19 ENCOUNTER — Other Ambulatory Visit: Payer: Self-pay

## 2015-07-19 ENCOUNTER — Encounter: Payer: Self-pay | Admitting: Gastroenterology

## 2015-07-19 ENCOUNTER — Other Ambulatory Visit (INDEPENDENT_AMBULATORY_CARE_PROVIDER_SITE_OTHER): Payer: 59

## 2015-07-19 ENCOUNTER — Ambulatory Visit (HOSPITAL_COMMUNITY): Admission: RE | Admit: 2015-07-19 | Payer: 59 | Source: Ambulatory Visit

## 2015-07-19 ENCOUNTER — Encounter (HOSPITAL_COMMUNITY): Payer: Self-pay

## 2015-07-19 ENCOUNTER — Ambulatory Visit (INDEPENDENT_AMBULATORY_CARE_PROVIDER_SITE_OTHER): Payer: 59 | Admitting: Gastroenterology

## 2015-07-19 ENCOUNTER — Ambulatory Visit (INDEPENDENT_AMBULATORY_CARE_PROVIDER_SITE_OTHER): Payer: 59

## 2015-07-19 VITALS — BP 122/82 | HR 88 | Ht 62.99 in | Wt 120.0 lb

## 2015-07-19 DIAGNOSIS — K8020 Calculus of gallbladder without cholecystitis without obstruction: Secondary | ICD-10-CM

## 2015-07-19 DIAGNOSIS — B179 Acute viral hepatitis, unspecified: Secondary | ICD-10-CM | POA: Insufficient documentation

## 2015-07-19 DIAGNOSIS — K7200 Acute and subacute hepatic failure without coma: Secondary | ICD-10-CM

## 2015-07-19 DIAGNOSIS — K838 Other specified diseases of biliary tract: Secondary | ICD-10-CM

## 2015-07-19 DIAGNOSIS — K831 Obstruction of bile duct: Secondary | ICD-10-CM

## 2015-07-19 HISTORY — DX: Calculus of gallbladder without cholecystitis without obstruction: K80.20

## 2015-07-19 LAB — HEPATIC FUNCTION PANEL
ALT: 2984 U/L — AB (ref 0–35)
AST: 763 U/L — ABNORMAL HIGH (ref 0–37)
Albumin: 4.1 g/dL (ref 3.5–5.2)
Alkaline Phosphatase: 59 U/L (ref 39–117)
BILIRUBIN TOTAL: 6.8 mg/dL — AB (ref 0.2–1.2)
Bilirubin, Direct: 4.7 mg/dL — ABNORMAL HIGH (ref 0.0–0.3)
TOTAL PROTEIN: 7.1 g/dL (ref 6.0–8.3)

## 2015-07-19 SURGERY — ENDOSCOPIC RETROGRADE CHOLANGIOPANCREATOGRAPHY (ERCP) WITH PROPOFOL
Anesthesia: General

## 2015-07-19 NOTE — Progress Notes (Signed)
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  Approximately one week ago she developed mild scleral icterus.  She is complaining of fatigue and mild lassitude.  She has known gallbladder stones determined by previous ultrasound.  She complains of mild right upper quadrant discomfort.  Today total bilirubin was 6.8, alkaline phosphatase 59, ALT 2984 and AST 763.  2 days ago ALT was 1275 and AST 879, total bilirubin 5.2 and alkaline phosphatase 65.  There is no history of hepatitis, jaundice, IV drug use, transfusions or heavy alcohol use.  Until 4 days ago she was taking a new oral contraceptive.  She has lost about 4 pounds over the past week.  She is without fever, joint pains or skin rash.  She has mild pruritus.  Abdominal ultrasound today, which I reviewed, demonstrated all bladder stones.  There was no gallbladder wall thickening.  Common bile duct measured 1.8 mm.   Past Medical History  Diagnosis Date  . Finger laceration involving tendon 10/11/2014    right ring and small fingers   Past Surgical History  Procedure Laterality Date  . Tonsillectomy and adenoidectomy    . Wound exploration Right 10/15/2014    Procedure: RIGHT RING WOUND EXPLORATION;  Surgeon: Leanora Cover, MD;  Location: [facility];  Service: Orthopedics;  Laterality: Right;  . Tendon repair Right 10/15/2014    Procedure: RIGHT SMALL TENDON REPAIR;  Surgeon: Leanora Cover, MD;  Location: [place];  Service: Orthopedics;  Laterality: Right;   family history is not on file. Current Outpatient Prescriptions  Medication Sig Dispense Refill  . Multiple Vitamins-Minerals (MULTIVITAMIN WITH MINERALS) tablet Take 1 tablet by mouth daily.     No current facility-administered  medications for this visit.   Allergies as of 07/19/2015  . (No Known Allergies)    reports that she has never smoked. She has never used smokeless tobacco. She reports that she drinks alcohol. She reports that she does not use illicit drugs.   Review of Systems: Pertinent positive and negative review of systems were noted in the above HPI section. All other review of systems were otherwise negative.  Vital signs were reviewed in today's medical record Physical Exam: General: Well developed , well nourished, no acute distress Skin: anicteric Head: Normocephalic and atraumatic Eyes: There is mild scleral icterus .EOMI Ears: Normal auditory acuity Mouth: No deformity or lesions Neck: Supple, no masses or thyromegaly Lymph Nodes: no lymphadenopathy Lungs: Clear throughout to auscultation Heart: Regular rate and rhythm; no murmurs, rubs or bruits Gastroinestinal: Soft, non tender and non distended. No masses, hepatosplenomegaly or hernias noted. Normal Bowel sounds Rectal:deferred Musculoskeletal: Symmetrical with no gross deformities  Skin: No lesions on visible extremities Pulses:  Normal pulses noted Extremities: No clubbing,  cyanosis, edema or deformities noted Neurological: Alert oriented x 4, grossly nonfocal Cervical Nodes:  No significant cervical adenopathy Inguinal Nodes: No significant inguinal adenopathy Psychological:  Alert and cooperative. Normal mood and affect  See Assessment and Plan under Problem List

## 2015-07-19 NOTE — Assessment & Plan Note (Signed)
At this point I don't think she is having symptoms referable to her gallbladder

## 2015-07-19 NOTE — Patient Instructions (Signed)
Your physician has requested that you go to the basement for lab work before leaving today  Please follow up with Dr. Deatra Ina on 08/05/2015 at 10:30am

## 2015-07-19 NOTE — Assessment & Plan Note (Addendum)
Clinical course and LFT abnormalities reflected in acute hepatitis.  Etiology is uncertain.  Acute viral hepatitis should be ruled out.  She apparently received one injection for hepatitis A vaccine several years ago.  There is nothing to suggest chronic liver disease.  Of note she was taking a new oral contraception of although not aware of an association between oral contraceptives and acute hepatitis.  I believe gallbladder stones are incidental.  Recommendations #1 repeat LFTs in 2 days.  Check INR, serologies for hepatitis A, B, and C, anti-mitochondrial antibody and antinuclear antibody  CC Dr. Kieth Brightly

## 2015-07-22 ENCOUNTER — Other Ambulatory Visit (INDEPENDENT_AMBULATORY_CARE_PROVIDER_SITE_OTHER): Payer: 59

## 2015-07-22 DIAGNOSIS — B179 Acute viral hepatitis, unspecified: Secondary | ICD-10-CM

## 2015-07-22 DIAGNOSIS — K7200 Acute and subacute hepatic failure without coma: Secondary | ICD-10-CM | POA: Diagnosis not present

## 2015-07-22 LAB — HEPATIC FUNCTION PANEL
ALT: 1839 U/L — AB (ref 0–35)
AST: 237 U/L — ABNORMAL HIGH (ref 0–37)
Albumin: 4.5 g/dL (ref 3.5–5.2)
Alkaline Phosphatase: 72 U/L (ref 39–117)
BILIRUBIN DIRECT: 2.6 mg/dL — AB (ref 0.0–0.3)
Total Bilirubin: 4.3 mg/dL — ABNORMAL HIGH (ref 0.2–1.2)
Total Protein: 7.7 g/dL (ref 6.0–8.3)

## 2015-07-22 LAB — HEPATITIS C ANTIBODY: HCV Ab: NEGATIVE

## 2015-07-22 LAB — HEPATITIS A ANTIBODY, TOTAL: HEP A TOTAL AB: REACTIVE — AB

## 2015-07-22 LAB — IBC PANEL
Iron: 100 ug/dL (ref 42–145)
Saturation Ratios: 21.6 % (ref 20.0–50.0)
Transferrin: 330 mg/dL (ref 212.0–360.0)

## 2015-07-22 LAB — HEPATITIS B SURFACE ANTIGEN: Hepatitis B Surface Ag: NEGATIVE

## 2015-07-22 LAB — IRON: IRON: 100 ug/dL (ref 42–145)

## 2015-07-22 LAB — FERRITIN: FERRITIN: 346.3 ng/mL — AB (ref 10.0–291.0)

## 2015-07-22 LAB — HEPATITIS B SURFACE ANTIBODY,QUALITATIVE: HEP B S AB: POSITIVE — AB

## 2015-07-22 LAB — PROTIME-INR
INR: 0.9 ratio (ref 0.8–1.0)
Prothrombin Time: 9.5 s — ABNORMAL LOW (ref 9.6–13.1)

## 2015-07-22 LAB — AMYLASE: Amylase: 53 U/L (ref 27–131)

## 2015-07-23 ENCOUNTER — Other Ambulatory Visit: Payer: 59

## 2015-07-23 ENCOUNTER — Encounter: Payer: Self-pay | Admitting: *Deleted

## 2015-07-23 ENCOUNTER — Telehealth: Payer: Self-pay | Admitting: Gastroenterology

## 2015-07-23 ENCOUNTER — Other Ambulatory Visit: Payer: Self-pay | Admitting: *Deleted

## 2015-07-23 DIAGNOSIS — B179 Acute viral hepatitis, unspecified: Secondary | ICD-10-CM

## 2015-07-23 LAB — ANA: Anti Nuclear Antibody(ANA): NEGATIVE

## 2015-07-23 NOTE — Telephone Encounter (Signed)
Not all of the labs were released to be viewed on my chart. Each lab result given to the patient.

## 2015-07-24 ENCOUNTER — Telehealth: Payer: Self-pay | Admitting: Gastroenterology

## 2015-07-24 ENCOUNTER — Other Ambulatory Visit: Payer: Self-pay

## 2015-07-24 DIAGNOSIS — B179 Acute viral hepatitis, unspecified: Secondary | ICD-10-CM

## 2015-07-24 LAB — IGG: IGG (IMMUNOGLOBIN G), SERUM: 813 mg/dL (ref 690–1700)

## 2015-07-24 LAB — IGM: IgM, Serum: 90 mg/dL (ref 52–322)

## 2015-07-24 NOTE — Telephone Encounter (Signed)
We are still awaiting serologies for hepatitis A.  If they are positive for an acute infection I would not return to work for at least another week.  She should have another set of LFTs sometime next week including an INR.  I will let her know as soon as hepatitis  serologies are back

## 2015-07-24 NOTE — Telephone Encounter (Signed)
Patient advised.

## 2015-07-25 ENCOUNTER — Telehealth: Payer: Self-pay | Admitting: Gastroenterology

## 2015-07-25 LAB — MITOCHONDRIAL ANTIBODIES: Mitochondrial M2 Ab, IgG: 0.79 (ref <0.91)

## 2015-07-26 NOTE — Telephone Encounter (Signed)
Message left with [facility].

## 2015-07-29 ENCOUNTER — Other Ambulatory Visit (INDEPENDENT_AMBULATORY_CARE_PROVIDER_SITE_OTHER): Payer: 59

## 2015-07-29 DIAGNOSIS — K7200 Acute and subacute hepatic failure without coma: Secondary | ICD-10-CM | POA: Diagnosis not present

## 2015-07-29 DIAGNOSIS — B179 Acute viral hepatitis, unspecified: Secondary | ICD-10-CM

## 2015-07-29 LAB — HEPATIC FUNCTION PANEL
ALT: 347 U/L — ABNORMAL HIGH (ref 0–35)
AST: 52 U/L — AB (ref 0–37)
Albumin: 4.2 g/dL (ref 3.5–5.2)
Alkaline Phosphatase: 56 U/L (ref 39–117)
BILIRUBIN DIRECT: 1 mg/dL — AB (ref 0.0–0.3)
BILIRUBIN TOTAL: 2 mg/dL — AB (ref 0.2–1.2)
Total Protein: 7.1 g/dL (ref 6.0–8.3)

## 2015-07-29 LAB — PROTIME-INR
INR: 0.9 ratio (ref 0.8–1.0)
Prothrombin Time: 10.4 s (ref 9.6–13.1)

## 2015-08-05 ENCOUNTER — Ambulatory Visit (INDEPENDENT_AMBULATORY_CARE_PROVIDER_SITE_OTHER): Payer: 59 | Admitting: Gastroenterology

## 2015-08-05 ENCOUNTER — Encounter: Payer: Self-pay | Admitting: Gastroenterology

## 2015-08-05 VITALS — BP 102/60 | HR 80 | Ht 62.9 in | Wt 121.8 lb

## 2015-08-05 DIAGNOSIS — K7200 Acute and subacute hepatic failure without coma: Secondary | ICD-10-CM

## 2015-08-05 DIAGNOSIS — B179 Acute viral hepatitis, unspecified: Secondary | ICD-10-CM

## 2015-08-05 NOTE — Assessment & Plan Note (Signed)
Resolving acute hepatitis.  Serologies for hepatitis A and B and C were negative.  Plan to check CMV and EBV viral titers.

## 2015-08-05 NOTE — Progress Notes (Signed)
      History of Present Illness:  Kathy Blackwell is generally feeling better with increased energy.  She is back to work.  Liver enzymes continue to fall.  Etiology has not been determined.    Review of Systems: Pertinent positive and negative review of systems were noted in the above HPI section. All other review of systems were otherwise negative.    Current Medications, Allergies, Past Medical History, Past Surgical History, Family History and Social History were reviewed in [facility] record  Vital signs were reviewed in today's medical record. Physical Exam: General: Well developed , well nourished, no acute distress  See Assessment and Plan under Problem List

## 2015-08-05 NOTE — Patient Instructions (Signed)
Go to the basement for labs today Follow up labs in 6 weeks LFT's

## 2015-08-07 ENCOUNTER — Other Ambulatory Visit: Payer: 59

## 2015-08-07 DIAGNOSIS — B179 Acute viral hepatitis, unspecified: Secondary | ICD-10-CM

## 2015-08-07 LAB — HEPATIC FUNCTION PANEL
ALBUMIN: 4 g/dL (ref 3.5–5.2)
ALT: 97 U/L — AB (ref 0–35)
AST: 33 U/L (ref 0–37)
Alkaline Phosphatase: 49 U/L (ref 39–117)
Bilirubin, Direct: 0.5 mg/dL — ABNORMAL HIGH (ref 0.0–0.3)
TOTAL PROTEIN: 6.7 g/dL (ref 6.0–8.3)
Total Bilirubin: 1.2 mg/dL (ref 0.2–1.2)

## 2015-08-08 LAB — EPSTEIN-BARR VIRUS VCA ANTIBODY PANEL
EBV VCA IgG: <10 U/mL (ref <18.0)
EBV VCA IgM: 13.9 U/mL (ref <36.0)

## 2015-08-08 LAB — CMV ANTIBODY, IGG (EIA): CMV Ab - IgG: <0.6 U/mL (ref 0.00–0.59)

## 2015-08-09 LAB — CMV IGM

## 2015-10-01 ENCOUNTER — Telehealth: Payer: Self-pay | Admitting: *Deleted

## 2015-10-01 NOTE — Telephone Encounter (Signed)
Called patient to inform that labs are due and 6 week follow up. She will need to decide who she wants to see as far as her GI care  Will put labs in once she decides

## 2015-10-01 NOTE — Telephone Encounter (Signed)
Pt's calling back.  Wants you to leave Vmail

## 2015-10-01 NOTE — Telephone Encounter (Signed)
Per patient , Dr Deatra Ina said she did not need further labs or to follow up.   I see in his result note now on the LFT's that no further labs was needed

## 2016-01-03 MED FILL — OVIDREL 250 MCG/0.5 ML SYRG: 250 | 30 days supply | Qty: 1 | Fill #0

## 2016-01-03 MED FILL — CLOMIPHENE CITRATE 50 MG TA: 50 | 5 days supply | Qty: 5 | Fill #0

## 2016-01-14 DIAGNOSIS — N970 Female infertility associated with anovulation: Secondary | ICD-10-CM | POA: Diagnosis not present

## 2016-01-17 DIAGNOSIS — Z3189 Encounter for other procreative management: Secondary | ICD-10-CM | POA: Diagnosis not present

## 2016-03-10 ENCOUNTER — Telehealth: Payer: Self-pay | Admitting: Gastroenterology

## 2016-03-10 NOTE — Telephone Encounter (Signed)
Yes that's fine. Thanks for asking!

## 2016-03-10 NOTE — Telephone Encounter (Signed)
Patient notified

## 2016-03-10 NOTE — Telephone Encounter (Signed)
This is a former patient of Dr Deatra Ina. She has a history of elevated LFT's and acute hepatitis. 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 Can I use one of your "banding" appointment slots for her?

## 2016-03-11 DIAGNOSIS — R945 Abnormal results of liver function studies: Secondary | ICD-10-CM | POA: Diagnosis not present

## 2016-03-19 ENCOUNTER — Ambulatory Visit (INDEPENDENT_AMBULATORY_CARE_PROVIDER_SITE_OTHER): Payer: 59 | Admitting: Gastroenterology

## 2016-03-19 ENCOUNTER — Encounter: Payer: Self-pay | Admitting: Gastroenterology

## 2016-03-19 VITALS — BP 106/70 | HR 88 | Ht 63.0 in | Wt 129.0 lb

## 2016-03-19 DIAGNOSIS — R740 Nonspecific elevation of levels of transaminase and lactic acid dehydrogenase [LDH]: Secondary | ICD-10-CM | POA: Diagnosis not present

## 2016-03-19 DIAGNOSIS — K8020 Calculus of gallbladder without cholecystitis without obstruction: Secondary | ICD-10-CM | POA: Diagnosis not present

## 2016-03-19 DIAGNOSIS — R7401 Elevation of levels of liver transaminase levels: Secondary | ICD-10-CM

## 2016-03-19 NOTE — Patient Instructions (Addendum)
Your physician has requested that you go to the basement for lab work before leaving today.     I appreciate the opportunity to care for you.   

## 2016-03-19 NOTE — Progress Notes (Signed)
HPI :   30 y/o female here for follow up. Former patient of Dr. Deatra Ina, history of elevated LAEs. She was seen last summer after she developed jaundice with an ALT > 2000s with hyperbilirubinemia. She felt fatigued at that time, but did not have any abdominal pains. She reported prior to this onset she was on an oral contraceptive. She denied any other medications, herbal supplements, or OTCs at the time. She had stopped the OCP a few days prior to her presentation. ALT peaked in the 2000s and then downtrended. She tested negative for acute viral etiologies and autoimmune hepatitis. She had a normal US of the liver other than a few small gallstones. CBD was normal at the time. INR was normal at the time  She has felt well in the interim without recurrence of symptoms. She has no abdominal pains. No jaundice. She feels well in general. She had clomid at the end of January / beginning of February - she took it for 5 days as part of fertility therapy. She had some baseline labs on March 15 which showed an ALT of 174 and AST of 51. Repeat labs on 03/11/16 - ALT had downtrended to 70s. She denies any FH of liver disease. No puritus or rashes. 1-2 drinks / week alcohol at most.  Recent labs  02/26/16: AP 107, ALT 174, AST 51, T bil 0.8, GGT 203, total protein 7.2, alb normal  03/11/16 A:T 70, AST 21, Alk phos 79, T bil 0.7   Past Medical History  Diagnosis Date  . Finger laceration involving tendon 10/11/2014    right ring and small fingers  . Acute hepatitis 07/2015  . Elevated LFTs      Past Surgical History  Procedure Laterality Date  . Tonsillectomy and adenoidectomy    . Wound exploration Right 10/15/2014    Procedure: RIGHT RING WOUND EXPLORATION;  Surgeon: Leanora Cover, MD;  Location: [place];  Service: Orthopedics;  Laterality: Right;  . Tendon repair Right 10/15/2014    Procedure: RIGHT SMALL TENDON REPAIR;  Surgeon: Leanora Cover, MD;  Location: [place];  Service: Orthopedics;  Laterality: Right;   History reviewed. No pertinent family history. Social History  Substance Use Topics  . Smoking status: Never Smoker   . Smokeless tobacco: Never Used  . Alcohol Use: Yes     Comment: occasionally   Current Outpatient Prescriptions  Medication Sig Dispense Refill  . Multiple Vitamins-Minerals (MULTIVITAMIN WITH MINERALS) tablet Take 1 tablet by mouth daily.     No current facility-administered medications for this visit.   No Known Allergies   Review of Systems: All systems reviewed and negative except where noted in HPI.    Lab Results  Component Value Date   ALT 97* 08/07/2015   AST 33 08/07/2015   ALKPHOS 49 08/07/2015   BILITOT 1.2 08/07/2015    No results found for: CREATININE, BUN, NA, K, CL, CO2  Lab Results  Component Value Date   HGB 14.9 10/15/2014    Lab Results  Component Value Date   INR 0.9 07/29/2015   INR 0.9 07/22/2015     Physical Exam: BP 106/70 mmHg  Pulse 88  Ht '5\' 3"'  (1.6 m)  Wt 129 lb (58.514 kg)  BMI 22.86 kg/m2  LMP 02/26/2016 Constitutional: Pleasant,well-developed, female in no acute distress. HEENT: Normocephalic and atraumatic. Conjunctivae are normal. No scleral icterus. Cardiovascular: Normal rate, regular rhythm.  Pulmonary/chest: Effort normal and breath sounds normal. No wheezing,  rales or rhonchi. Abdominal: Soft, nondistended, nontender. Bowel sounds active throughout. There are no masses palpable. No hepatomegaly. Extremities: no edema Neurological: Alert and oriented to person place and time. Skin: Skin is warm and dry. No rashes noted. Psychiatric: Normal mood and affect. Behavior is normal.   ASSESSMENT AND PLAN: 30 y/o female here for follow up. History as above. She had an acute liver injury last year of unclear etiology. She had hyperbilirubinemia with marked elevation of ALT to > 2000. She had no evidence of biliary obstruction and tested negative for viral  etiologies and autoimmune hepatitis. It is possible she had a drug induced liver injury related to an oral contraceptive at the time, or perhaps an acute viral infection that was not tested for.   Her ALT previously downtrended, but noted to be elevated to 170s on recent blood work last month. No other medications she has taken recently with exception of Clomid for fertility treatment. Her last ALT has since downtrended to the 70s. Again it's possible she had a drug induced liver injury, unclear how high her transaminases may have been, but recommend repeating it again in a few weeks. At that time I would would add the labs not sent previously to complete the workup for chronic liver diseases although these, other than autoimmune or Wilson's, would not be thought to cause her acute presentation in 2016. If her ALT remains persistently elevated and serologic workup is negative for chronic liver diseases, then I would consider a liver biopsy to further evaluate. If the ALT has normalized, I would then check it again in a month or two to ensure it stays normal.   Patient agreed with plan and recommendations. She will contact me in the interim with any questions / concerns, or development of symptoms. In regards to her gallstones, I think this is an incidental finding and not causing symptoms.   [place] Cellar, MD [facility] Pager [phone]

## 2016-04-02 ENCOUNTER — Other Ambulatory Visit (INDEPENDENT_AMBULATORY_CARE_PROVIDER_SITE_OTHER): Payer: 59

## 2016-04-02 DIAGNOSIS — R740 Nonspecific elevation of levels of transaminase and lactic acid dehydrogenase [LDH]: Secondary | ICD-10-CM

## 2016-04-02 DIAGNOSIS — R7401 Elevation of levels of liver transaminase levels: Secondary | ICD-10-CM

## 2016-04-02 LAB — IGA: IGA: 153 mg/dL (ref 68–378)

## 2016-04-02 LAB — HEPATIC FUNCTION PANEL
ALT: 102 U/L — AB (ref 0–35)
AST: 33 U/L (ref 0–37)
Albumin: 4.3 g/dL (ref 3.5–5.2)
Alkaline Phosphatase: 82 U/L (ref 39–117)
BILIRUBIN DIRECT: 0.1 mg/dL (ref 0.0–0.3)
BILIRUBIN TOTAL: 0.6 mg/dL (ref 0.2–1.2)
Total Protein: 7 g/dL (ref 6.0–8.3)

## 2016-04-03 LAB — ANTI-SMOOTH MUSCLE ANTIBODY, IGG: Smooth Muscle Ab: <20 U (ref <20)

## 2016-04-06 LAB — TISSUE TRANSGLUTAMINASE, IGA: TISSUE TRANSGLUTAMINASE AB, IGA: 1 U/mL (ref <4)

## 2016-04-06 LAB — CERULOPLASMIN: CERULOPLASMIN: 26 mg/dL (ref 18–53)

## 2016-04-06 LAB — ALPHA-1-ANTITRYPSIN: A1 ANTITRYPSIN SER: 139 mg/dL (ref 83–199)

## 2016-04-08 ENCOUNTER — Other Ambulatory Visit: Payer: Self-pay | Admitting: *Deleted

## 2016-04-08 DIAGNOSIS — R7401 Elevation of levels of liver transaminase levels: Secondary | ICD-10-CM

## 2016-04-08 DIAGNOSIS — R740 Nonspecific elevation of levels of transaminase and lactic acid dehydrogenase [LDH]: Principal | ICD-10-CM

## 2016-05-06 ENCOUNTER — Ambulatory Visit (HOSPITAL_COMMUNITY): Payer: 59

## 2016-05-12 ENCOUNTER — Other Ambulatory Visit: Payer: Self-pay | Admitting: Physician Assistant

## 2016-05-13 ENCOUNTER — Ambulatory Visit (HOSPITAL_COMMUNITY)
Admission: RE | Admit: 2016-05-13 | Discharge: 2016-05-13 | Disposition: A | Discharge status: Home / Self Care | Payer: 59 | Source: Ambulatory Visit | Attending: Gastroenterology | Admitting: Gastroenterology

## 2016-05-13 ENCOUNTER — Encounter (HOSPITAL_COMMUNITY): Payer: Self-pay

## 2016-05-13 DIAGNOSIS — R740 Nonspecific elevation of levels of transaminase and lactic acid dehydrogenase [LDH]: Secondary | ICD-10-CM | POA: Insufficient documentation

## 2016-05-13 DIAGNOSIS — K739 Chronic hepatitis, unspecified: Secondary | ICD-10-CM | POA: Diagnosis not present

## 2016-05-13 DIAGNOSIS — R7401 Elevation of levels of liver transaminase levels: Secondary | ICD-10-CM

## 2016-05-13 DIAGNOSIS — K8020 Calculus of gallbladder without cholecystitis without obstruction: Secondary | ICD-10-CM | POA: Diagnosis not present

## 2016-05-13 DIAGNOSIS — R945 Abnormal results of liver function studies: Secondary | ICD-10-CM | POA: Diagnosis not present

## 2016-05-13 LAB — APTT: APTT: 26 s (ref 24–37)

## 2016-05-13 LAB — COMPREHENSIVE METABOLIC PANEL
ALT: 87 U/L — ABNORMAL HIGH (ref 14–54)
ANION GAP: 8 (ref 5–15)
AST: 33 U/L (ref 15–41)
Albumin: 4.5 g/dL (ref 3.5–5.0)
Alkaline Phosphatase: 53 U/L (ref 38–126)
BILIRUBIN TOTAL: 0.6 mg/dL (ref 0.3–1.2)
BUN: 13 mg/dL (ref 6–20)
CO2: 25 mmol/L (ref 22–32)
Calcium: 9.7 mg/dL (ref 8.9–10.3)
Chloride: 106 mmol/L (ref 101–111)
Creatinine, Ser: 0.75 mg/dL (ref 0.44–1.00)
Glucose, Bld: 84 mg/dL (ref 65–99)
POTASSIUM: 4.1 mmol/L (ref 3.5–5.1)
Sodium: 139 mmol/L (ref 135–145)
TOTAL PROTEIN: 7.1 g/dL (ref 6.5–8.1)

## 2016-05-13 LAB — PROTIME-INR
INR: 1.05 (ref 0.00–1.49)
PROTHROMBIN TIME: 13.5 s (ref 11.6–15.2)

## 2016-05-13 LAB — CBC
HCT: 42.8 % (ref 36.0–46.0)
Hemoglobin: 15.1 g/dL — ABNORMAL HIGH (ref 12.0–15.0)
MCH: 29.9 pg (ref 26.0–34.0)
MCHC: 35.3 g/dL (ref 30.0–36.0)
MCV: 84.8 fL (ref 78.0–100.0)
PLATELETS: 226 10*3/uL (ref 150–400)
RBC: 5.05 MIL/uL (ref 3.87–5.11)
RDW: 12.3 % (ref 11.5–15.5)
WBC: 5.3 10*3/uL (ref 4.0–10.5)

## 2016-05-13 LAB — HCG, SERUM, QUALITATIVE: PREG SERUM: NEGATIVE

## 2016-05-13 MED ORDER — SODIUM CHLORIDE 0.9 % IV SOLN
INTRAVENOUS | Status: DC
  Administered 2016-05-13: 11:00:00 via INTRAVENOUS

## 2016-05-13 MED ORDER — MIDAZOLAM HCL 2 MG/2ML IJ SOLN
INTRAMUSCULAR | Status: DC
Start: 2016-05-13 — End: 2016-05-14
  Filled 2016-05-13: qty 4

## 2016-05-13 MED ORDER — FLUMAZENIL 0.5 MG/5ML IV SOLN
INTRAVENOUS | Status: AC
  Filled 2016-05-13: qty 5

## 2016-05-13 MED ORDER — FENTANYL CITRATE (PF) 100 MCG/2ML IJ SOLN
INTRAMUSCULAR | Status: AC | PRN
  Administered 2016-05-13: 50 ug via INTRAVENOUS
  Administered 2016-05-13 (×2): 25 ug via INTRAVENOUS

## 2016-05-13 MED ORDER — NALOXONE HCL 0.4 MG/ML IJ SOLN
INTRAMUSCULAR | Status: AC
  Filled 2016-05-13: qty 1

## 2016-05-13 MED ORDER — FENTANYL CITRATE (PF) 100 MCG/2ML IJ SOLN
INTRAMUSCULAR | Status: DC
Start: 2016-05-13 — End: 2016-05-14
  Filled 2016-05-13: qty 2

## 2016-05-13 MED ORDER — MIDAZOLAM HCL 2 MG/2ML IJ SOLN
INTRAMUSCULAR | Status: AC | PRN
  Administered 2016-05-13 (×2): 1 mg via INTRAVENOUS

## 2016-05-13 NOTE — Sedation Documentation (Signed)
Patient denies pain and is resting comfortably.  

## 2016-05-13 NOTE — Discharge Instructions (Signed)
Liver Biopsy, Care After °Refer to this sheet in the next few weeks. These instructions provide you with information on caring for yourself after your procedure. Your health care provider may also give you more specific instructions. Your treatment has been planned according to current medical practices, but problems sometimes occur. Call your health care provider if you have any problems or questions after your procedure. °WHAT TO EXPECT AFTER THE PROCEDURE °After your procedure, it is typical to have the following: °· A small amount of discomfort in the area where the biopsy was done and in the right shoulder or shoulder blade. °· A small amount of bruising around the area where the biopsy was done and on the skin over the liver. °· Sleepiness and fatigue for the rest of the day. °HOME CARE INSTRUCTIONS  °· Rest at home for 1-2 days or as directed by your health care provider. °· Have a friend or family member stay with you for at least 24 hours. °· Because of the medicines used during the procedure, you should not do the following things in the first 24 hours: °¨ Drive. °¨ Use machinery. °¨ Be responsible for the care of other people. °¨ Sign legal documents. °¨ Take a bath or shower. °· There are many different ways to close and cover an incision, including stitches, skin glue, and adhesive strips. Follow your health care provider's instructions on: °¨ Incision care. °¨ Bandage (dressing) changes and removal. °¨ Incision closure removal. °· Do not drink alcohol in the first week. °· Do not lift more than 5 pounds or play contact sports for 2 weeks after this test. °· Take medicines only as directed by your health care provider. Do not take medicine containing aspirin or non-steroidal anti-inflammatory medicines such as ibuprofen for 1 week after this test. °· It is your responsibility to get your test results. °SEEK MEDICAL CARE IF:  °· You have increased bleeding from an incision that results in more than a  small spot of blood. °· You have redness, swelling, or increasing pain in any incisions. °· You notice a discharge or a bad smell coming from any of your incisions. °· You have a fever or chills. °SEEK IMMEDIATE MEDICAL CARE IF:  °· You develop swelling, bloating, or pain in your abdomen. °· You become dizzy or faint. °· You develop a rash. °· You are nauseous or vomit. °· You have difficulty breathing, feel short of breath, or feel faint. °· You develop chest pain. °· You have problems with your speech or vision. °· You have trouble balancing or moving your arms or legs. °  °This information is not intended to replace advice given to you by your health care provider. Make sure you discuss any questions you have with your health care provider. °  °Document Released: 06/19/2005 Document Revised: 12/21/2014 Document Reviewed: 01/26/2014 °Elsevier Interactive Patient Education ©2016 [employer] ° °Moderate Conscious Sedation, Adult, Care After °Refer to this sheet in the next few weeks. These instructions provide you with information on caring for yourself after your procedure. Your health care provider may also give you more specific instructions. Your treatment has been planned according to current medical practices, but problems sometimes occur. Call your health care provider if you have any problems or questions after your procedure. °WHAT TO EXPECT AFTER THE PROCEDURE  °After your procedure: °· You may feel sleepy, clumsy, and have poor balance for several hours. °· Vomiting may occur if you eat too soon after the procedure. °  HOME CARE INSTRUCTIONS °· Do not participate in any activities where you could become injured for at least 24 hours. Do not: °¨ Drive. °¨ Swim. °¨ Ride a bicycle. °¨ Operate heavy machinery. °¨ Cook. °¨ Use power tools. °¨ Climb ladders. °¨ Work from a high place. °· Do not make important decisions or sign legal documents until you are improved. °· If you vomit, drink water, juice, or soup  when you can drink without vomiting. Make sure you have little or no nausea before eating solid foods. °· Only take over-the-counter or prescription medicines for pain, discomfort, or fever as directed by your health care provider. °· Make sure you and your family fully understand everything about the medicines given to you, including what side effects may occur. °· You should not drink alcohol, take sleeping pills, or take medicines that cause drowsiness for at least 24 hours. °· If you smoke, do not smoke without supervision. °· If you are feeling better, you may resume normal activities 24 hours after you were sedated. °· Keep all appointments with your health care provider. °SEEK MEDICAL CARE IF: °· Your skin is pale or bluish in color. °· You continue to feel nauseous or vomit. °· Your pain is getting worse and is not helped by medicine. °· You have bleeding or swelling. °· You are still sleepy or feeling clumsy after 24 hours. °SEEK IMMEDIATE MEDICAL CARE IF: °· You develop a rash. °· You have difficulty breathing. °· You develop any type of allergic problem. °· You have a fever. °MAKE SURE YOU: °· Understand these instructions. °· Will watch your condition. °· Will get help right away if you are not doing well or get worse. °  °This information is not intended to replace advice given to you by your health care provider. Make sure you discuss any questions you have with your health care provider. °  °Document Released: 09/20/2013 Document Revised: 12/21/2014 Document Reviewed: 09/20/2013 °Elsevier Interactive Patient Education ©2016 [employer] Inc[employer] ° ° °

## 2016-05-13 NOTE — Procedures (Signed)
Successful RANDOM [place] RT LIVER BX NO COMP STABLE PATH PENDING FULL REPORT IN PACS

## 2016-05-13 NOTE — Consult Note (Signed)
Chief Complaint: Patient was seen in consultation today for US guided random core liver biopsy  Referring Physician(s): Manus Gunning  Supervising Physician: Daryll Brod  Patient Status: Out-pt  History of Present Illness: Kathy Blackwell is a 30 y.o. female with history of elevated LFTs/acute hepatitis in 2016. Serologic workup has been negative for chronic liver diseases. Ultrasound of abdomen in 2016 revealed only cholelithiasis. She has since had persistently elevated ALT levels and only home medication at this time is multivitamin. She presents today for ultrasound-guided random core liver biopsy for further evaluation.  Past Medical History  Diagnosis Date  . Finger laceration involving tendon 10/11/2014    right ring and small fingers  . Acute hepatitis 07/2015  . Elevated LFTs     Past Surgical History  Procedure Laterality Date  . Tonsillectomy and adenoidectomy    . Wound exploration Right 10/15/2014    Procedure: RIGHT RING WOUND EXPLORATION;  Surgeon: Leanora Cover, MD;  Location: [place];  Service: Orthopedics;  Laterality: Right;  . Tendon repair Right 10/15/2014    Procedure: RIGHT SMALL TENDON REPAIR;  Surgeon: Leanora Cover, MD;  Location: [place];  Service: Orthopedics;  Laterality: Right;    Allergies: Review of patient's allergies indicates no known allergies.  Medications: Prior to Admission medications   Medication Sig Start Date End Date Taking? Authorizing Provider  Multiple Vitamins-Minerals (MULTIVITAMIN WITH MINERALS) tablet Take 1 tablet by mouth daily.   Yes Historical Provider, MD     History reviewed. No pertinent family history.  Social History   Social History  . Marital Status: Married    Spouse Name: N/A  . Number of Children: 0  . Years of Education: N/A   Occupational History  . Physician    Social History Main Topics  . Smoking status: Never Smoker   . Smokeless tobacco:  Never Used  . Alcohol Use: Yes     Comment: occasionally  . Drug Use: No  . Sexual Activity: Not Asked   Other Topics Concern  . None   Social History Narrative      Review of Systems  Constitutional: Negative for fever, chills and fatigue.  Respiratory: Negative for cough and shortness of breath.   Cardiovascular: Negative for chest pain.  Gastrointestinal: Negative for nausea, vomiting, abdominal pain and blood in stool.  Genitourinary: Negative for dysuria and hematuria.  Musculoskeletal: Negative for back pain.  Neurological: Negative for headaches.    Vital Signs: LMP 04/23/2016  blood pressure 121/76, temp 98.5, heart rate 86, respirations 16, O2 sats 100% room air  Physical Exam  Constitutional: She is oriented to person, place, and time. She appears well-developed and well-nourished.  Cardiovascular: Normal rate and regular rhythm.   Pulmonary/Chest: Effort normal and breath sounds normal.  Abdominal: Soft. Bowel sounds are normal. There is no tenderness.  Musculoskeletal: Normal range of motion. She exhibits no edema.  Neurological: She is alert and oriented to person, place, and time.    Mallampati Score:     Imaging: No results found.  Labs:  CBC: No results for input(s): WBC, HGB, HCT, PLT in the last 8760 hours.  COAGS:  Recent Labs  07/22/15 0847 07/29/15 1107  INR 0.9 0.9    BMP: No results for input(s): NA, K, CL, CO2, GLUCOSE, BUN, CALCIUM, CREATININE, GFRNONAA, GFRAA in the last 8760 hours.  Invalid input(s): CMP  LIVER FUNCTION TESTS:  Recent Labs  07/22/15 0847 07/29/15 1107 08/07/15 0734 04/02/16 [id number]  BILITOT 4.3* 2.0* 1.2 0.6  AST 237* 52* 33 33  ALT 1839* 347* 97* 102*  ALKPHOS 72 56 49 82  PROT 7.7 7.1 6.7 7.0  ALBUMIN 4.5 4.2 4.0 4.3    TUMOR MARKERS: No results for input(s): AFPTM, CEA, CA199, CHROMGRNA in the last 8760 hours.  Assessment and Plan: 30 y.o. female with history of elevated LFTs/acute hepatitis  in 2016. Serologic workup has been negative for chronic liver diseases. Ultrasound of abdomen in 2016 revealed only cholelithiasis. She has since had persistently elevated ALT levels and only home medication at this time is multivitamin. She presents today for ultrasound-guided random core liver biopsy for further evaluation.Risks and benefits discussed with the patient/spouse including, but not limited to bleeding, infection, damage to adjacent structures or low yield requiring additional tests.All of the patient's questions were answered, patient is agreeable to proceed.Consent signed and in chart. Labs pending.     Thank you for this interesting consult.  I greatly enjoyed meeting Kathy Blackwell and look forward to participating in their care.  A copy of this report was sent to the requesting provider on this date.  Electronically Signed: D. Rowe Robert 05/13/2016, 11:39 AM   I spent a total of 20 minutes in face to face in clinical consultation, greater than 50% of which was counseling/coordinating care for ultrasound-guided random core liver biopsy

## 2016-06-04 ENCOUNTER — Encounter: Payer: Self-pay | Admitting: Gastroenterology

## 2016-06-05 ENCOUNTER — Telehealth: Payer: Self-pay | Admitting: *Deleted

## 2016-06-05 NOTE — Telephone Encounter (Signed)
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 Dr. Lyndon Code mentioned he was going to send it out. Can you touch base with pathology to see if they have a read on it yet, or when she can expect to have a result? Thanks much

## 2016-06-05 NOTE — Telephone Encounter (Signed)
Spoke with Pathology and it was sent to [facility]. Nothing back yet. Sent on 05/21/16.

## 2016-06-08 NOTE — Telephone Encounter (Signed)
Thanks for looking into this. If I have not heard back in a week we should reach out to them again and check on the progress. Thanks

## 2016-06-17 DIAGNOSIS — N970 Female infertility associated with anovulation: Secondary | ICD-10-CM | POA: Diagnosis not present

## 2016-06-29 ENCOUNTER — Encounter: Payer: Self-pay | Admitting: Gastroenterology

## 2016-06-30 ENCOUNTER — Telehealth: Payer: Self-pay | Admitting: Gastroenterology

## 2016-06-30 DIAGNOSIS — R7401 Elevation of levels of liver transaminase levels: Secondary | ICD-10-CM

## 2016-06-30 DIAGNOSIS — R740 Nonspecific elevation of levels of transaminase and lactic acid dehydrogenase [LDH]: Principal | ICD-10-CM

## 2016-06-30 NOTE — Telephone Encounter (Signed)
Liver biopsy report was finally obtained from [facility] second opinion. Report will be scanned into Epic.  Liver tissue with rare portal and lobular ceroid macrophages. Essentially these are nonspecific changes that indicate a remote liver injury, but no morphologic evidence of ongoing injury. There is no bile duct injury, active hepatitis, or fibrosis present.   At this time it is unclear what caused prior liver injury but it appears nothing active based on biopsy.   I discussed findings with the patient, and recommend repeat LFTs to be done 3-4 months from her last draw, due to end of August, early September.   She agreed with the plan.

## 2016-07-03 DIAGNOSIS — Z3189 Encounter for other procreative management: Secondary | ICD-10-CM | POA: Diagnosis not present

## 2016-07-05 IMAGING — US US BIOPSY
1 series · 11 of 11 positions shown · non-contrast
Comparison: none

INDICATION: Mild acute hepatitis, elevated LFTs

[Series 1: us biopsy · 0.20mm/px · 11 of 11 slices shown]
[im 1/11]
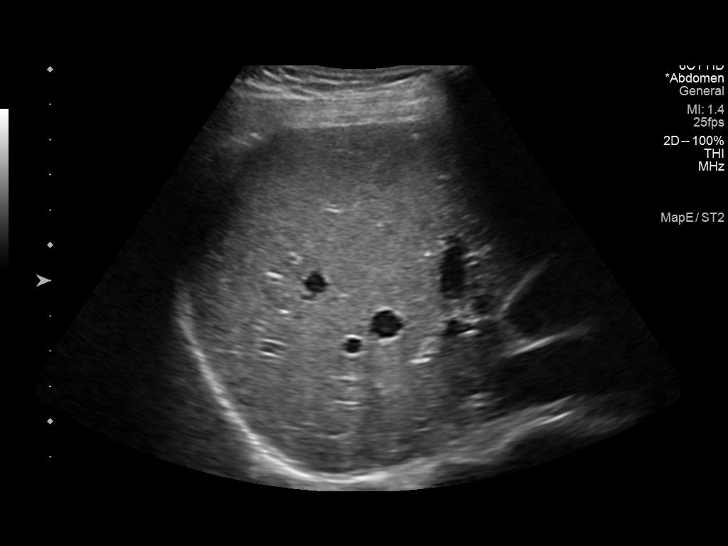
[im 2/11]
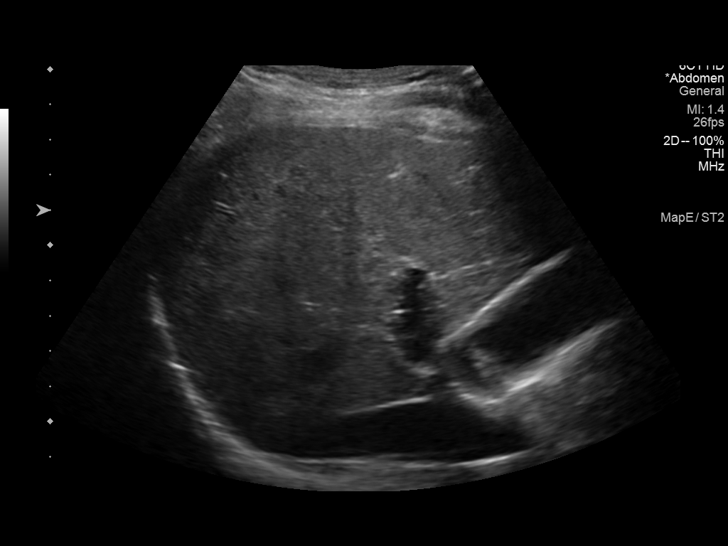
[im 3/11]
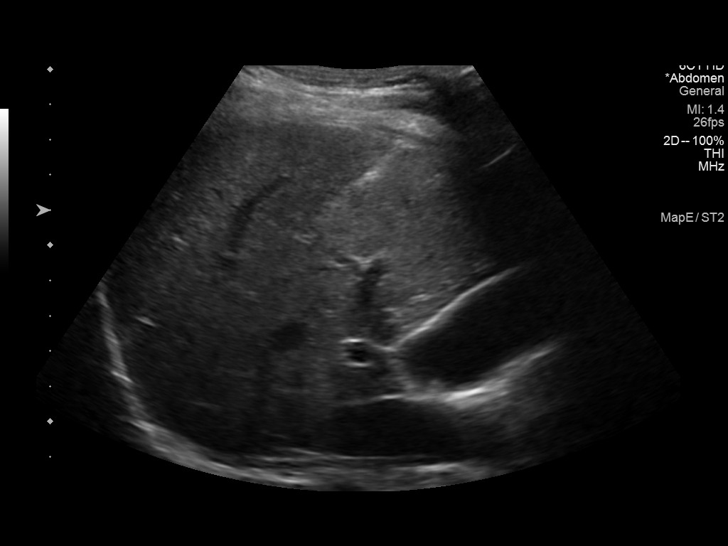
[im 4/11]
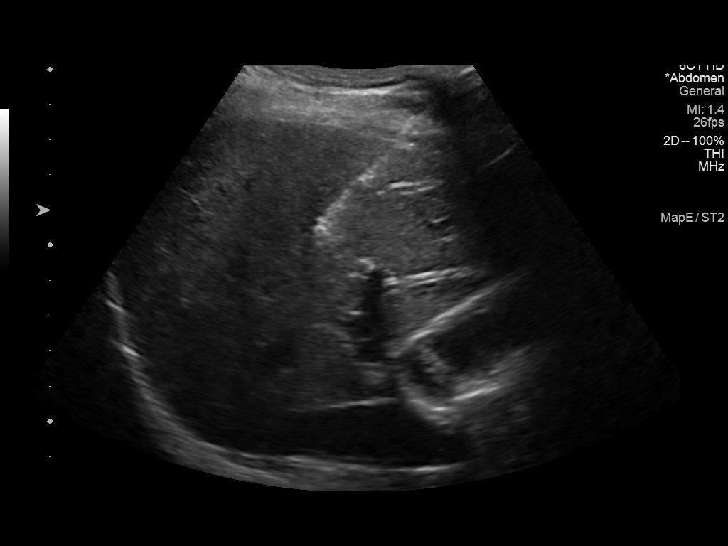
[im 5/11]
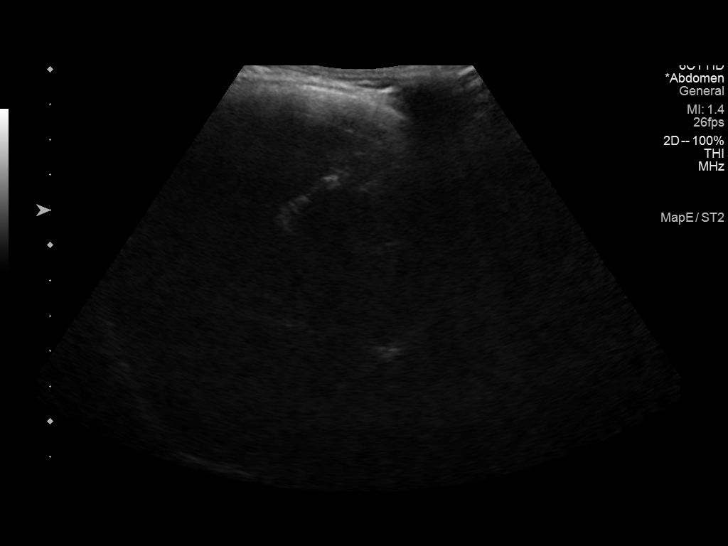
[im 6/11]
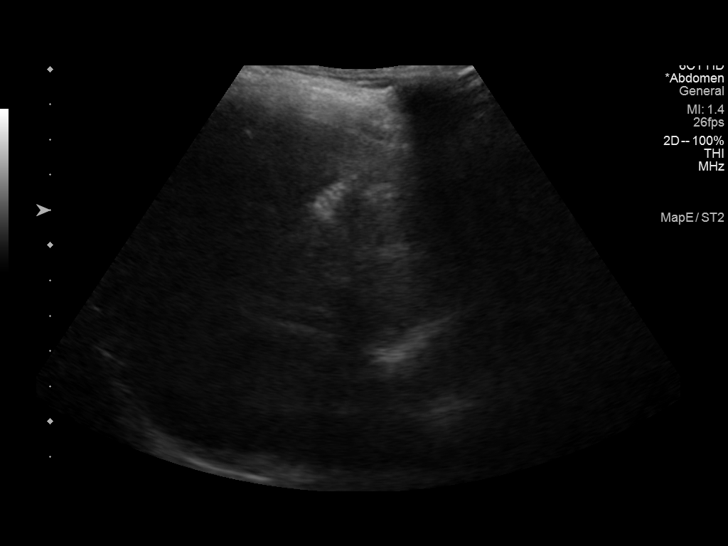
[im 7/11]
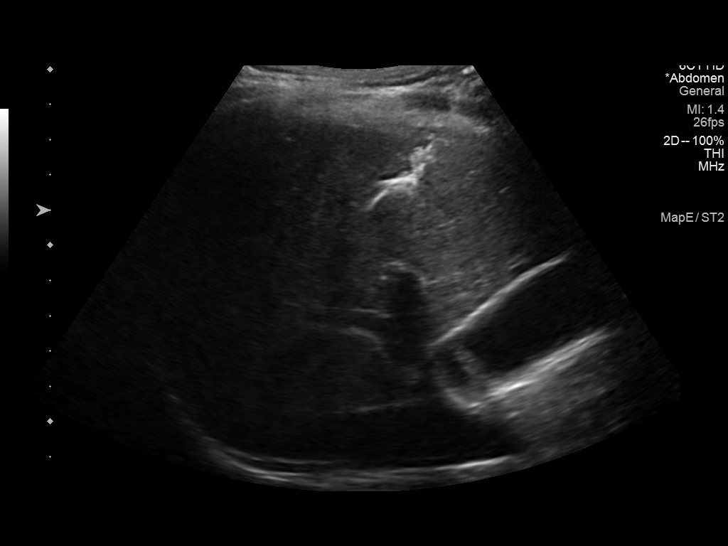
[im 8/11]
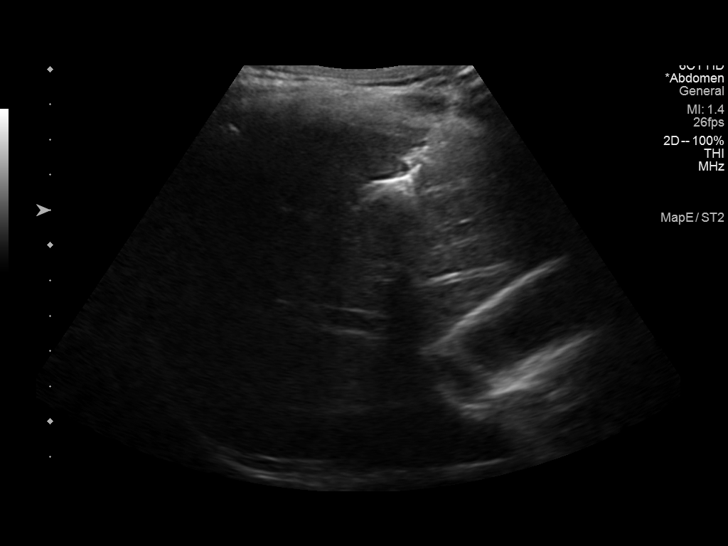
[im 9/11]
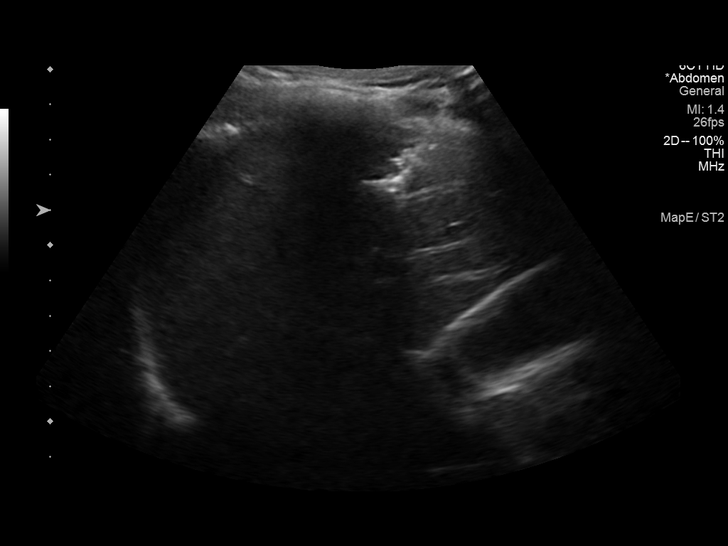
[im 10/11]
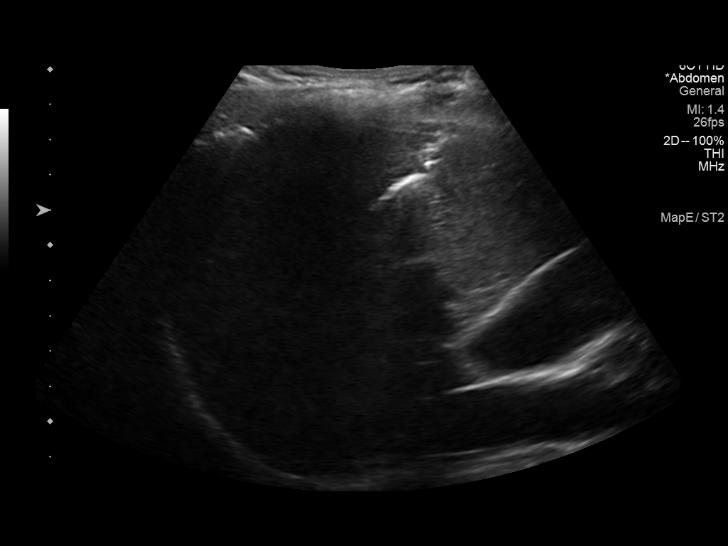
[im 11/11]
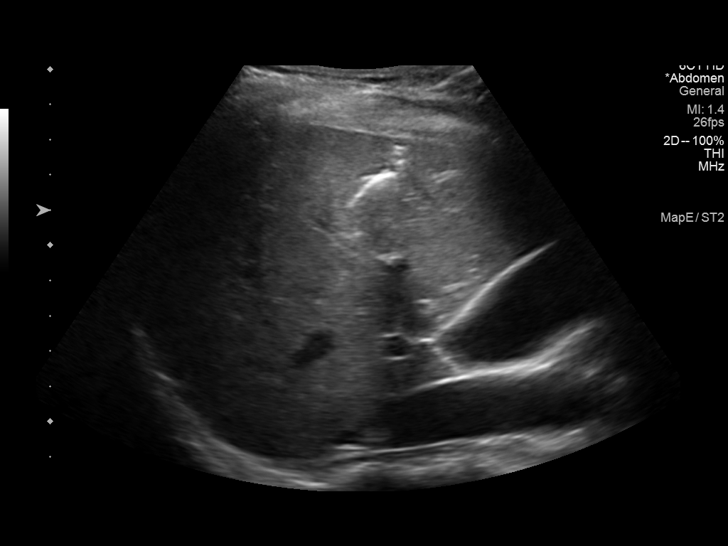

[11 of 11 positions shown; findings below may reference images not displayed]

EXAM:
ULTRASOUND BIOPSY CORE LIVER

MEDICATIONS:
1% lidocaine locally

ANESTHESIA/SEDATION:
Moderate (conscious) sedation was employed during this procedure. A
total of Versed 2.0 mg and Fentanyl 100 mcg was administered
intravenously.

Moderate Sedation Time: 10 minutes. The patient's level of
consciousness and vital signs were monitored continuously by
radiology nursing throughout the procedure under my direct
supervision.

FLUOROSCOPY TIME:  Fluoroscopy Time: None.

COMPLICATIONS:
None immediate.

PROCEDURE:
Informed written consent was obtained from the patient after a
thorough discussion of the procedural risks, benefits and
alternatives. All questions were addressed. Maximal Sterile Barrier
Technique was utilized including caps, mask, sterile gowns, sterile
gloves, sterile drape, hand hygiene and skin antiseptic. A timeout
was performed prior to the initiation of the procedure.

Preliminary ultrasound performed. Right hepatic lobe localized in
the right upper quadrant through a lower intercostal space in the
mid axillary line. Under sterile conditions and local anesthesia,
ultrasound percutaneous needle access performed of the right hepatic
lobe. Needle position confirmed with ultrasound. 2 18 gauge core
biopsies obtained. Needle tract embolized with Gel-Foam pledgets.
Postprocedure imaging demonstrates no hemorrhage or hematoma.
Patient tolerated the biopsy well. Samples placed in formalin. These
were intact and non fragmented.
IMPRESSION: Successful ultrasound random right hepatic lobe core biopsy.

## 2016-07-31 DIAGNOSIS — Z3189 Encounter for other procreative management: Secondary | ICD-10-CM | POA: Diagnosis not present

## 2016-08-21 ENCOUNTER — Other Ambulatory Visit (INDEPENDENT_AMBULATORY_CARE_PROVIDER_SITE_OTHER): Payer: 59

## 2016-08-21 DIAGNOSIS — R7401 Elevation of levels of liver transaminase levels: Secondary | ICD-10-CM

## 2016-08-21 DIAGNOSIS — R740 Nonspecific elevation of levels of transaminase and lactic acid dehydrogenase [LDH]: Secondary | ICD-10-CM

## 2016-08-21 LAB — HEPATIC FUNCTION PANEL
ALBUMIN: 4.4 g/dL (ref 3.5–5.2)
ALK PHOS: 55 U/L (ref 39–117)
ALT: 57 U/L — ABNORMAL HIGH (ref 0–35)
AST: 23 U/L (ref 0–37)
Bilirubin, Direct: 0.2 mg/dL (ref 0.0–0.3)
TOTAL PROTEIN: 7 g/dL (ref 6.0–8.3)
Total Bilirubin: 0.6 mg/dL (ref 0.2–1.2)

## 2016-08-25 ENCOUNTER — Other Ambulatory Visit: Payer: Self-pay

## 2016-08-25 DIAGNOSIS — R945 Abnormal results of liver function studies: Principal | ICD-10-CM

## 2016-08-25 DIAGNOSIS — R7989 Other specified abnormal findings of blood chemistry: Secondary | ICD-10-CM

## 2016-08-28 DIAGNOSIS — Z3189 Encounter for other procreative management: Secondary | ICD-10-CM | POA: Diagnosis not present

## 2016-09-25 DIAGNOSIS — Z3189 Encounter for other procreative management: Secondary | ICD-10-CM | POA: Diagnosis not present

## 2016-10-01 DIAGNOSIS — N62 Hypertrophy of breast: Secondary | ICD-10-CM | POA: Diagnosis not present

## 2016-10-23 DIAGNOSIS — Z3189 Encounter for other procreative management: Secondary | ICD-10-CM | POA: Diagnosis not present

## 2016-11-27 DIAGNOSIS — N911 Secondary amenorrhea: Secondary | ICD-10-CM | POA: Diagnosis not present

## 2016-12-14 NOTE — L&D Delivery Note (Addendum)
Delivery Note Called to come to room 166 for imminent delivery.    Meconium noted and head crowning with first push.  At 8:43 AM a viable female was delivered via Vaginal, Spontaneous Delivery of fetus.  Dr. Helane Rima arrived as head was delivering.  She took over at that point to finish delayed cord clamping and delivery of placenta.  Silas Sacramento 05/22/2017, 8:47 AM   I clamped cord and delivered placenta - noted to be meconium stained and intact Sent to pathology  Female infant vigorous - NICU in attendance  First degree laceration closed with 3.0 chromic  ebl 200

## 2016-12-16 DIAGNOSIS — Z3401 Encounter for supervision of normal first pregnancy, first trimester: Secondary | ICD-10-CM | POA: Diagnosis not present

## 2016-12-16 LAB — OB RESULTS CONSOLE RUBELLA ANTIBODY, IGM: Rubella: IMMUNE

## 2016-12-16 LAB — OB RESULTS CONSOLE RPR: RPR: NONREACTIVE

## 2016-12-16 LAB — OB RESULTS CONSOLE HIV ANTIBODY (ROUTINE TESTING): HIV: NONREACTIVE

## 2016-12-16 LAB — OB RESULTS CONSOLE HEPATITIS B SURFACE ANTIGEN: Hepatitis B Surface Ag: NEGATIVE

## 2016-12-23 DIAGNOSIS — Z3480 Encounter for supervision of other normal pregnancy, unspecified trimester: Secondary | ICD-10-CM | POA: Diagnosis not present

## 2016-12-23 DIAGNOSIS — Z36 Encounter for antenatal screening for chromosomal anomalies: Secondary | ICD-10-CM | POA: Diagnosis not present

## 2016-12-23 DIAGNOSIS — Z3401 Encounter for supervision of normal first pregnancy, first trimester: Secondary | ICD-10-CM | POA: Diagnosis not present

## 2017-01-06 DIAGNOSIS — Z36 Encounter for antenatal screening for chromosomal anomalies: Secondary | ICD-10-CM | POA: Diagnosis not present

## 2017-01-06 DIAGNOSIS — Z3491 Encounter for supervision of normal pregnancy, unspecified, first trimester: Secondary | ICD-10-CM | POA: Diagnosis not present

## 2017-01-06 DIAGNOSIS — Z3682 Encounter for antenatal screening for nuchal translucency: Secondary | ICD-10-CM | POA: Diagnosis not present

## 2017-02-25 DIAGNOSIS — Z3480 Encounter for supervision of other normal pregnancy, unspecified trimester: Secondary | ICD-10-CM | POA: Diagnosis not present

## 2017-02-25 DIAGNOSIS — Z3400 Encounter for supervision of normal first pregnancy, unspecified trimester: Secondary | ICD-10-CM | POA: Diagnosis not present

## 2017-02-25 DIAGNOSIS — Z363 Encounter for antenatal screening for malformations: Secondary | ICD-10-CM | POA: Diagnosis not present

## 2017-02-25 DIAGNOSIS — Z3A19 19 weeks gestation of pregnancy: Secondary | ICD-10-CM | POA: Diagnosis not present

## 2017-02-25 DIAGNOSIS — Z362 Encounter for other antenatal screening follow-up: Secondary | ICD-10-CM | POA: Diagnosis not present

## 2017-03-17 DIAGNOSIS — H01003 Unspecified blepharitis right eye, unspecified eyelid: Secondary | ICD-10-CM | POA: Diagnosis not present

## 2017-03-17 DIAGNOSIS — H5213 Myopia, bilateral: Secondary | ICD-10-CM | POA: Diagnosis not present

## 2017-03-17 DIAGNOSIS — Z3492 Encounter for supervision of normal pregnancy, unspecified, second trimester: Secondary | ICD-10-CM | POA: Diagnosis not present

## 2017-04-21 DIAGNOSIS — Z23 Encounter for immunization: Secondary | ICD-10-CM | POA: Diagnosis not present

## 2017-04-21 DIAGNOSIS — Z3480 Encounter for supervision of other normal pregnancy, unspecified trimester: Secondary | ICD-10-CM | POA: Diagnosis not present

## 2017-04-21 DIAGNOSIS — Z3400 Encounter for supervision of normal first pregnancy, unspecified trimester: Secondary | ICD-10-CM | POA: Diagnosis not present

## 2017-05-05 ENCOUNTER — Other Ambulatory Visit: Payer: Self-pay

## 2017-05-05 ENCOUNTER — Telehealth: Payer: Self-pay | Admitting: Gastroenterology

## 2017-05-05 NOTE — Telephone Encounter (Signed)
Called patient back, she can come in and get the LFT's checked, order is still in the system.

## 2017-05-06 ENCOUNTER — Other Ambulatory Visit (INDEPENDENT_AMBULATORY_CARE_PROVIDER_SITE_OTHER): Payer: 59

## 2017-05-06 DIAGNOSIS — R7989 Other specified abnormal findings of blood chemistry: Secondary | ICD-10-CM

## 2017-05-06 DIAGNOSIS — R945 Abnormal results of liver function studies: Principal | ICD-10-CM

## 2017-05-06 LAB — HEPATIC FUNCTION PANEL
ALT: 327 U/L — AB (ref 0–35)
AST: 109 U/L — AB (ref 0–37)
Albumin: 3.3 g/dL — ABNORMAL LOW (ref 3.5–5.2)
Alkaline Phosphatase: 197 U/L — ABNORMAL HIGH (ref 39–117)
BILIRUBIN TOTAL: 0.5 mg/dL (ref 0.2–1.2)
Bilirubin, Direct: 0.2 mg/dL (ref 0.0–0.3)
TOTAL PROTEIN: 6.1 g/dL (ref 6.0–8.3)

## 2017-05-07 ENCOUNTER — Other Ambulatory Visit: Payer: Self-pay

## 2017-05-07 ENCOUNTER — Observation Stay (HOSPITAL_COMMUNITY)
Admission: AD | Admit: 2017-05-07 | Discharge: 2017-05-11 | Disposition: A | Discharge status: Home / Self Care | Payer: 59 | Source: Ambulatory Visit | Attending: Obstetrics and Gynecology | Admitting: Obstetrics and Gynecology

## 2017-05-07 ENCOUNTER — Observation Stay (HOSPITAL_COMMUNITY): Payer: 59

## 2017-05-07 ENCOUNTER — Encounter (HOSPITAL_COMMUNITY): Payer: Self-pay | Admitting: *Deleted

## 2017-05-07 DIAGNOSIS — R7401 Elevation of levels of liver transaminase levels: Secondary | ICD-10-CM | POA: Diagnosis present

## 2017-05-07 DIAGNOSIS — R7989 Other specified abnormal findings of blood chemistry: Secondary | ICD-10-CM

## 2017-05-07 DIAGNOSIS — O26613 Liver and biliary tract disorders in pregnancy, third trimester: Secondary | ICD-10-CM | POA: Diagnosis not present

## 2017-05-07 DIAGNOSIS — Z3A30 30 weeks gestation of pregnancy: Secondary | ICD-10-CM | POA: Insufficient documentation

## 2017-05-07 DIAGNOSIS — K831 Obstruction of bile duct: Secondary | ICD-10-CM | POA: Insufficient documentation

## 2017-05-07 DIAGNOSIS — R748 Abnormal levels of other serum enzymes: Secondary | ICD-10-CM | POA: Diagnosis present

## 2017-05-07 DIAGNOSIS — R945 Abnormal results of liver function studies: Secondary | ICD-10-CM

## 2017-05-07 DIAGNOSIS — O26619 Liver and biliary tract disorders in pregnancy, unspecified trimester: Secondary | ICD-10-CM

## 2017-05-07 DIAGNOSIS — Z3689 Encounter for other specified antenatal screening: Secondary | ICD-10-CM | POA: Diagnosis not present

## 2017-05-07 DIAGNOSIS — O26649 Intrahepatic cholestasis of pregnancy, unspecified trimester: Secondary | ICD-10-CM | POA: Diagnosis present

## 2017-05-07 DIAGNOSIS — O9989 Other specified diseases and conditions complicating pregnancy, childbirth and the puerperium: Secondary | ICD-10-CM | POA: Diagnosis not present

## 2017-05-07 LAB — COMPREHENSIVE METABOLIC PANEL
ALBUMIN: 3.1 g/dL — AB (ref 3.5–5.0)
ALT: 477 U/L — ABNORMAL HIGH (ref 14–54)
ANION GAP: 8 (ref 5–15)
AST: 185 U/L — AB (ref 15–41)
Alkaline Phosphatase: 204 U/L — ABNORMAL HIGH (ref 38–126)
BILIRUBIN TOTAL: 0.6 mg/dL (ref 0.3–1.2)
BUN: 12 mg/dL (ref 6–20)
CHLORIDE: 105 mmol/L (ref 101–111)
CO2: 23 mmol/L (ref 22–32)
Calcium: 9.1 mg/dL (ref 8.9–10.3)
Creatinine, Ser: 0.61 mg/dL (ref 0.44–1.00)
GFR calc Af Amer: >60 mL/min (ref >60)
GFR calc non Af Amer: >60 mL/min (ref >60)
GLUCOSE: 103 mg/dL — AB (ref 65–99)
POTASSIUM: 3.9 mmol/L (ref 3.5–5.1)
Sodium: 136 mmol/L (ref 135–145)
TOTAL PROTEIN: 7 g/dL (ref 6.5–8.1)

## 2017-05-07 LAB — PROTEIN / CREATININE RATIO, URINE
CREATININE, URINE: 141 mg/dL
PROTEIN CREATININE RATIO: 0.35 mg/mg{creat} — AB (ref 0.00–0.15)
TOTAL PROTEIN, URINE: 49 mg/dL

## 2017-05-07 LAB — CBC
HEMATOCRIT: 35.9 % — AB (ref 36.0–46.0)
Hemoglobin: 12.3 g/dL (ref 12.0–15.0)
MCH: 31.2 pg (ref 26.0–34.0)
MCHC: 34.3 g/dL (ref 30.0–36.0)
MCV: 91.1 fL (ref 78.0–100.0)
PLATELETS: 239 10*3/uL (ref 150–400)
RBC: 3.94 MIL/uL (ref 3.87–5.11)
RDW: 13.7 % (ref 11.5–15.5)
WBC: 9.6 10*3/uL (ref 4.0–10.5)

## 2017-05-07 LAB — TYPE AND SCREEN
ABO/RH(D): O POS
Antibody Screen: NEGATIVE

## 2017-05-07 LAB — ABO/RH: ABO/RH(D): O POS

## 2017-05-07 MED ORDER — DOCUSATE SODIUM 100 MG PO CAPS
100.0000 mg | ORAL_CAPSULE | Freq: Every day | ORAL | Status: DC
Start: 2017-05-08 — End: 2017-05-11
  Filled 2017-05-07: qty 1

## 2017-05-07 MED ORDER — CALCIUM CARBONATE ANTACID 500 MG PO CHEW: 2.0000 | CHEWABLE_TABLET | ORAL | Status: DC | PRN

## 2017-05-07 MED ORDER — ACETAMINOPHEN 325 MG PO TABS: 650.0000 mg | ORAL_TABLET | ORAL | Status: DC | PRN

## 2017-05-07 MED ORDER — PRENATAL MULTIVITAMIN CH
1.0000 | ORAL_TABLET | Freq: Every day | ORAL | Status: DC
  Administered 2017-05-08 – 2017-05-11 (×4): 1 via ORAL
  Filled 2017-05-07 (×4): qty 1

## 2017-05-07 MED ORDER — ZOLPIDEM TARTRATE 5 MG PO TABS
5.0000 mg | ORAL_TABLET | Freq: Every evening | ORAL | Status: DC | PRN
Start: 2017-05-07 — End: 2017-05-11

## 2017-05-07 NOTE — H&P (Signed)
Kathy Blackwell is a 31 y.o. female presenting for elevated liver enzymes. Patient describes onset of itching over entire body about 1 week ago, especially palms and soles of feet at night, mild to moderate. No medications for this. Had blood work done at her office. AST/ALT=109/327. She denies CNS changes, epigastric pain. BPs in office have been normal and urine protein negative. She notes a history of "idiopathic hepatitis" in 2016 with LFT in 1000s at one point. This resolved and a subsequent bout of itching led to liver biopsy and extensive work up that was apparently non diagnostic. OB History    Gravida Para Term Preterm AB Living   1             SAB TAB Ectopic Multiple Live Births                 Past Medical History:  Diagnosis Date  . Acute hepatitis 07/2015  . Elevated LFTs   . Finger laceration involving tendon 10/11/2014   right ring and small fingers   Past Surgical History:  Procedure Laterality Date  . TENDON REPAIR Right 10/15/2014   Procedure: RIGHT SMALL TENDON REPAIR;  Surgeon: Leanora Cover, MD;  Location: [place];  Service: Orthopedics;  Laterality: Right;  . TONSILLECTOMY AND ADENOIDECTOMY    . WOUND EXPLORATION Right 10/15/2014   Procedure: RIGHT RING WOUND EXPLORATION;  Surgeon: Leanora Cover, MD;  Location: [place];  Service: Orthopedics;  Laterality: Right;   Family History: family history is not on file. Social History:  reports that she has never smoked. She has never used smokeless tobacco. She reports that she drinks alcohol. She reports that she does not use drugs.     Maternal Diabetes: No Genetic Screening: Normal Maternal Ultrasounds/Referrals: Normal Fetal Ultrasounds or other Referrals:  None Maternal Substance Abuse:  No Significant Maternal Medications:  None Significant Maternal Lab Results:  See above Other Comments:  None  Review of Systems  Constitutional: Negative for chills and fever.  Eyes:  Negative for blurred vision.  Gastrointestinal: Negative for abdominal pain.  Neurological: Negative for headaches.   Maternal Medical History:  Fetal activity: Perceived fetal activity is normal.        Blood pressure 124/76, pulse 99, temperature 98.2 F (36.8 C), temperature source Oral, resp. rate 18, weight 144 lb 0.6 oz (65.3 kg), SpO2 100 %. Maternal Exam:  Abdomen: Patient reports no abdominal tenderness.   Fetal Exam Fetal State Assessment: Category I - tracings are normal.     Physical Exam  Cardiovascular: Normal rate and regular rhythm.   Respiratory: Effort normal and breath sounds normal.  GI: Soft. Bowel sounds are normal. There is no tenderness.  Neurological: She has normal reflexes.    Prenatal labs: ABO, Rh:   Antibody:   Rubella:   RPR:    HBsAg:    HIV:    GBS:     Results for orders placed or performed during the hospital encounter of 05/07/17 (from the past 24 hour(s))  Protein / creatinine ratio, urine     Status: Abnormal   Collection Time: 05/07/17  1:43 PM  Result Value Ref Range   Creatinine, Urine 141.00 mg/dL   Total Protein, Urine 49 mg/dL   Protein Creatinine Ratio 0.35 (H) 0.00 - 0.15 mg/mg[Cre]  CBC     Status: Abnormal   Collection Time: 05/07/17  2:39 PM  Result Value Ref Range   WBC 9.6 4.0 - 10.5 K/uL  RBC 3.94 3.87 - 5.11 MIL/uL   Hemoglobin 12.3 12.0 - 15.0 g/dL   HCT 35.9 (L) 36.0 - 46.0 %   MCV 91.1 78.0 - 100.0 fL   MCH 31.2 26.0 - 34.0 pg   MCHC 34.3 30.0 - 36.0 g/dL   RDW 13.7 11.5 - 15.5 %   Platelets 239 150 - 400 K/uL  Comprehensive metabolic panel     Status: Abnormal   Collection Time: 05/07/17  3:19 PM  Result Value Ref Range   Sodium 136 135 - 145 mmol/L   Potassium 3.9 3.5 - 5.1 mmol/L   Chloride 105 101 - 111 mmol/L   CO2 23 22 - 32 mmol/L   Glucose, Bld 103 (H) 65 - 99 mg/dL   BUN 12 6 - 20 mg/dL   Creatinine, Ser 0.61 0.44 - 1.00 mg/dL   Calcium 9.1 8.9 - 10.3 mg/dL   Total Protein 7.0 6.5 - 8.1  g/dL   Albumin 3.1 (L) 3.5 - 5.0 g/dL   AST 185 (H) 15 - 41 U/L   ALT 477 (H) 14 - 54 U/L   Alkaline Phosphatase 204 (H) 38 - 126 U/L   Total Bilirubin 0.6 0.3 - 1.2 mg/dL   GFR calc non Af Amer >60 >60 mL/min   GFR calc Af Amer >60 >60 mL/min   Anion gap 8 5 - 15   D/W MFM who recommend overnight observation with 24 hour urine and repeat labs  Assessment/Plan: 30 yo G1P0 IUI pregnancy @ 30 0/7 weeks with elevated liver enzymes  Possible atypical HELLP, intrahepatic cholestasis of pregnancy, acute fatty liver of pregnancy Observation Fetal U/S Repeat labs in am with coags GI consult If delivery appears imminent will give betamethasone and magnesium sulfate for neuroprotection   Mandisa Persinger II,Shafin Pollio E 05/07/2017, 5:44 PM

## 2017-05-07 NOTE — MAU Note (Signed)
Urine sent to lab 

## 2017-05-07 NOTE — MAU Note (Signed)
Patient was called by ob to come to mau for further evaluation due to elevated liver functions.  Denies headache, epigastric pain, changes in vision, or increase swelling.   Denies pain at this time.  Denies vaginal bleeding or discharge. +FM

## 2017-05-07 NOTE — MAU Provider Note (Signed)
Patient Kathy Blackwell is a 31 y.o. G1P0 at [redacted]w[redacted]d here after elevated LFTs at her GI office. She has a history of elevated liver enzymes and has been followed by Dr. Trinna Balloon. Patient denies bleeding, leaking of fluid, decreased fetal movements, swelling, HA, blurry vision, epigastric pain.  History     CSN: [id number]  Arrival date and time: 05/07/17 1327   First Provider Initiated Contact with Patient 05/07/17 1421      Chief Complaint  Patient presents with  . elevated liver function   HPI  OB History    Gravida Para Term Preterm AB Living   1             SAB TAB Ectopic Multiple Live Births                  Past Medical History:  Diagnosis Date  . Acute hepatitis 07/2015  . Elevated LFTs   . Finger laceration involving tendon 10/11/2014   right ring and small fingers    Past Surgical History:  Procedure Laterality Date  . TENDON REPAIR Right 10/15/2014   Procedure: RIGHT SMALL TENDON REPAIR;  Surgeon: Leanora Cover, MD;  Location: [place];  Service: Orthopedics;  Laterality: Right;  . TONSILLECTOMY AND ADENOIDECTOMY    . WOUND EXPLORATION Right 10/15/2014   Procedure: RIGHT RING WOUND EXPLORATION;  Surgeon: Leanora Cover, MD;  Location: [place];  Service: Orthopedics;  Laterality: Right;    History reviewed. No pertinent family history.  Social History  Substance Use Topics  . Smoking status: Never Smoker  . Smokeless tobacco: Never Used  . Alcohol use Yes     Comment: occasionally    Allergies: No Known Allergies  Prescriptions Prior to Admission  Medication Sig Dispense Refill Last Dose  . Multiple Vitamins-Minerals (MULTIVITAMIN WITH MINERALS) tablet Take 1 tablet by mouth daily.   Past Week at Unknown time    Review of Systems  Respiratory: Negative.   Cardiovascular: Negative.   Gastrointestinal: Negative.   Endocrine: Negative.   Genitourinary: Negative.   Neurological: Negative.    Physical Exam    Blood pressure 135/75, pulse (!) 107, temperature 98.2 F (36.8 C), temperature source Oral, resp. rate 18, weight 144 lb 0.6 oz (65.3 kg), SpO2 100 %.  Physical Exam  Constitutional: She is oriented to person, place, and time. She appears well-developed and well-nourished.  HENT:  Head: Normocephalic.  Neck: Normal range of motion.  Respiratory: Effort normal.  GI: Soft. She exhibits no distension and no mass. There is no tenderness. There is no rebound and no guarding.  Genitourinary: Vagina normal.  Musculoskeletal: Normal range of motion.  Neurological: She is alert and oriented to person, place, and time.  Skin: Skin is warm and dry.  Psychiatric: She has a normal mood and affect.    MAU Course  Procedures  MDM -CBC -CMP -Bile salts -UPC  Assessment and Plan  Dr. Gaetano Net at the bedside to evaluate patient.   Mervyn Skeeters Kealie Barrie 05/07/2017, 2:34 PM

## 2017-05-07 NOTE — MAU Note (Signed)
2 attempts at an IV. Both would not thread. Labs drawn

## 2017-05-07 NOTE — Progress Notes (Signed)
done

## 2017-05-08 ENCOUNTER — Inpatient Hospital Stay (HOSPITAL_COMMUNITY): Payer: 59

## 2017-05-08 DIAGNOSIS — R748 Abnormal levels of other serum enzymes: Secondary | ICD-10-CM | POA: Diagnosis not present

## 2017-05-08 DIAGNOSIS — O26613 Liver and biliary tract disorders in pregnancy, third trimester: Secondary | ICD-10-CM | POA: Diagnosis not present

## 2017-05-08 DIAGNOSIS — K831 Obstruction of bile duct: Secondary | ICD-10-CM | POA: Diagnosis not present

## 2017-05-08 DIAGNOSIS — Z3A30 30 weeks gestation of pregnancy: Secondary | ICD-10-CM

## 2017-05-08 DIAGNOSIS — K753 Granulomatous hepatitis, not elsewhere classified: Secondary | ICD-10-CM | POA: Diagnosis not present

## 2017-05-08 DIAGNOSIS — O26619 Liver and biliary tract disorders in pregnancy, unspecified trimester: Secondary | ICD-10-CM | POA: Diagnosis not present

## 2017-05-08 LAB — CBC
HCT: 34.5 % — ABNORMAL LOW (ref 36.0–46.0)
Hemoglobin: 11.7 g/dL — ABNORMAL LOW (ref 12.0–15.0)
MCH: 31.4 pg (ref 26.0–34.0)
MCHC: 33.9 g/dL (ref 30.0–36.0)
MCV: 92.5 fL (ref 78.0–100.0)
PLATELETS: 206 10*3/uL (ref 150–400)
RBC: 3.73 MIL/uL — AB (ref 3.87–5.11)
RDW: 13.7 % (ref 11.5–15.5)
WBC: 8.7 10*3/uL (ref 4.0–10.5)

## 2017-05-08 LAB — CREATININE CLEARANCE, URINE, 24 HOUR
COLLECTION INTERVAL-CRCL: 24 h
CREATININE 24H UR: 1191 mg/d (ref 600–1800)
Creatinine Clearance: 162 mL/min — ABNORMAL HIGH (ref 75–115)
Creatinine, Urine: 49.63 mg/dL
Urine Total Volume-CRCL: 2400 mL

## 2017-05-08 LAB — COMPREHENSIVE METABOLIC PANEL
ALK PHOS: 188 U/L — AB (ref 38–126)
ALT: 450 U/L — ABNORMAL HIGH (ref 14–54)
ANION GAP: 7 (ref 5–15)
AST: 164 U/L — ABNORMAL HIGH (ref 15–41)
Albumin: 2.5 g/dL — ABNORMAL LOW (ref 3.5–5.0)
BILIRUBIN TOTAL: 0.7 mg/dL (ref 0.3–1.2)
BUN: 15 mg/dL (ref 6–20)
CALCIUM: 8.8 mg/dL — AB (ref 8.9–10.3)
CO2: 22 mmol/L (ref 22–32)
Chloride: 108 mmol/L (ref 101–111)
Creatinine, Ser: 0.51 mg/dL (ref 0.44–1.00)
GFR calc non Af Amer: >60 mL/min (ref >60)
Glucose, Bld: 88 mg/dL (ref 65–99)
Potassium: 3.6 mmol/L (ref 3.5–5.1)
Sodium: 137 mmol/L (ref 135–145)
TOTAL PROTEIN: 5.5 g/dL — AB (ref 6.5–8.1)

## 2017-05-08 LAB — URIC ACID: Uric Acid, Serum: 3.5 mg/dL (ref 2.3–6.6)

## 2017-05-08 LAB — PROTEIN, URINE, 24 HOUR
Collection Interval-UPROT: 24 hours
PROTEIN, 24H URINE: 264 mg/d — AB (ref 50–100)
Protein, Urine: 11 mg/dL
Urine Total Volume-UPROT: 2400 mL

## 2017-05-08 LAB — PROTIME-INR
INR: 0.89
Prothrombin Time: 12 seconds (ref 11.4–15.2)

## 2017-05-08 LAB — BILE ACIDS, TOTAL: Bile Acids Total: 160.3 umol/L — ABNORMAL HIGH (ref 4.7–24.5)

## 2017-05-08 LAB — FIBRINOGEN: Fibrinogen: 596 mg/dL — ABNORMAL HIGH (ref 210–475)

## 2017-05-08 LAB — APTT: APTT: 24 s (ref 24–36)

## 2017-05-08 LAB — MONONUCLEOSIS SCREEN: Mono Screen: NEGATIVE

## 2017-05-08 NOTE — Consult Note (Signed)
Consultation  Referring Provider:     Dr. Everlene Farrier Primary Care Physician:  [facility] Herschell Dimes, MD Primary Gastroenterologist:     Havery Moros   Reason for Consultation:     Elevation in liver enzymes         HPI:   Kathy Blackwell is a 31 y.o. female well known to me from prior clinic visits.  She has a history of hospital admission in 2016 for ALT > 2000s with hyperbilirubinemia which resolved on its own. She had negative lab workup and imaging at that time.   I met her when she followed up for this issue in 2017, at which time she had a mild ALT elevation to the 50s which had persisted. Serologic workup for liver disease was negative. She had a liver biopsy for which she had a second pathologic read at [facility] in July 2017, the biopsy was essentially normal with some nonspecific changes of prior injury.   She was recently found to have interval routine lab testing with an ALT of 300s. I instructed her to speak with her OB and she was admitted for further evaluation. She is [redacted] weeks pregnant. She denies any abdominal pain, generally feels well, but does note some pruritus which is new. No LE edema. No mental status changes.   She has normal platelets, INR normal. ALT has increased to 400s. Bilirubin remains normal.  No new medications or supplement use.      Past Medical History:  Diagnosis Date  . Acute hepatitis 07/2015  . Elevated LFTs   . Finger laceration involving tendon 10/11/2014   right ring and small fingers    Past Surgical History:  Procedure Laterality Date  . TENDON REPAIR Right 10/15/2014   Procedure: RIGHT SMALL TENDON REPAIR;  Surgeon: Leanora Cover, MD;  Location: [place];  Service: Orthopedics;  Laterality: Right;  . TONSILLECTOMY AND ADENOIDECTOMY    . WOUND EXPLORATION Right 10/15/2014   Procedure: RIGHT RING WOUND EXPLORATION;  Surgeon: Leanora Cover, MD;  Location: [place];  Service: Orthopedics;   Laterality: Right;    History reviewed. No pertinent family history.  No FH of liver diseases or malignancy noted.  Social History  Substance Use Topics  . Smoking status: Never Smoker  . Smokeless tobacco: Never Used  . Alcohol use Yes     Comment: occasionally    Prior to Admission medications   Medication Sig Start Date End Date Taking? Authorizing Provider  Prenatal Vit-Fe Fumarate-FA (PRENATAL MULTIVITAMIN) TABS tablet Take 1 tablet by mouth daily at 12 noon.   Yes [provider]    Current Facility-Administered Medications  Medication Dose Route Frequency Provider Last Rate Last Dose  . acetaminophen (TYLENOL) tablet 650 mg  650 mg Oral Q4H PRN Everlene Farrier, MD      . calcium carbonate (TUMS - dosed in mg elemental calcium) chewable tablet 400 mg of elemental calcium  2 tablet Oral Q4H PRN Everlene Farrier, MD      . docusate sodium (COLACE) capsule 100 mg  100 mg Oral Daily Everlene Farrier, MD      . prenatal multivitamin tablet 1 tablet  1 tablet Oral Q1200 Everlene Farrier, MD      . zolpidem (AMBIEN) tablet 5 mg  5 mg Oral QHS PRN Everlene Farrier, MD        Allergies as of 05/07/2017  . (No Known Allergies)     Review of Systems:    As  per HPI, otherwise negative    Physical Exam:  Vital signs in last 24 hours: Temp:  [98.1 F (36.7 C)-98.4 F (36.9 C)] 98.2 F (36.8 C) (05/26 0802) Pulse Rate:  [83-107] 83 (05/26 0802) Resp:  [16-18] 18 (05/26 0802) BP: (114-135)/(68-79) 120/70 (05/26 0802) SpO2:  [100 %] 100 % (05/26 0802) Weight:  [144 lb 0.6 oz (65.3 kg)] 144 lb 0.6 oz (65.3 kg) (05/25 1346)   General:   Pleasant female in NAD Head:  Normocephalic and atraumatic. Eyes:   No icterus.   Conjunctiva pink. Neck:  Supple Lungs:  Respirations even and unlabored. Lungs clear to auscultation bilaterally.    Heart:  Regular rate and rhythm; no MRG Abdomen:  Soft,  nontender.  Gravid uterus. No appreciable masses or hepatomegaly.  Rectal:  Not  performed.  Msk:  Symmetrical without gross deformities.  Extremities:  Without edema. Neurologic:  Alert and  oriented x4;  grossly normal neurologically. Skin:  Intact without significant lesions or rashes. Psych:  Alert and cooperative. Normal affect.  LAB RESULTS:  Recent Labs  05/07/17 1439 05/08/17 0540  WBC 9.6 8.7  HGB 12.3 11.7*  HCT 35.9* 34.5*  PLT 239 206   BMET  Recent Labs  05/07/17 1519 05/08/17 0540  NA 136 137  K 3.9 3.6  CL 105 108  CO2 23 22  GLUCOSE 103* 88  BUN 12 15  CREATININE 0.61 0.51  CALCIUM 9.1 8.8*   LFT  Recent Labs  05/06/17 0739  05/08/17 0540  PROT 6.1  < > 5.5*  ALBUMIN 3.3*  < > 2.5*  AST 109*  < > 164*  ALT 327*  < > 450*  ALKPHOS 197*  < > 188*  BILITOT 0.5  < > 0.7  BILIDIR 0.2  --   --   < > = values in this interval not displayed. PT/INR  Recent Labs  05/08/17 0540  LABPROT 12.0  INR 0.89    STUDIES: No results found.      Impression / Plan:  31 y/o female with a history of remote acute liver injury, with mild baseline chronic ALT elevation of unclear etiology based on prior imaging, liver biopsy, and labs, now [redacted] weeks pregnant and presenting with ALT in 300-400s.   I discussed differential with patient. It's not clear to me if this is an acute on chronic elevation, or an issue specifically related to her pregnancy. In regards to chronic causes, there has been suspicion for autoimmune hepatitis but biopsy and labs did not support this.   At this time need to rule out acute viral etiologies, obtain US with doppler to evaluate parenchyma, rule out thrombosis and gallstones / choledocholithiasis (seems unlikely without pain). Normal INR and platelets reassuring. Will repeat autoimmune serologies to ensure negative. Awaiting 24 hour urine protein in regards to evaluation for pre-ecclampsia / HELLP syndrome which seems unlikely at this time. Intrahepatic cholestasis of pregnancy is possible, have sent bile acid  level and is pending, but can present with this lab abnormality, although pruritus is not too severe at this time. Acute fatty liver of pregnancy also on the differential but also seems less likely, she does not meet criteria for this, the patient generally feels quite well.   Will see what [place] and labs show, and would repeat LFTs in the morning.  She will need hepatology consultation in the near future, hopefully as outpatient pending her workup and course this weekend is reassuring.   I will follow with  you, please call with any questions.   Salvo Cellar, MD [facility]astroenter[facility] Pager [phone]

## 2017-05-08 NOTE — Progress Notes (Signed)
30 1/7 weeks  No C/O, no  CNS changes, no epigastric pain  Vitals:   05/08/17 0500 05/08/17 0802  BP: 114/68 120/70  Pulse: 84 83  Resp: 16 18  Temp: 98.1 F (36.7 C) 98.2 F (36.8 C)   Abdomen no epigastric tenderness Uterus soft, NT  FHT cat one last pm  Results for orders placed or performed during the hospital encounter of 05/07/17 (from the past 24 hour(s))  Protein / creatinine ratio, urine     Status: Abnormal   Collection Time: 05/07/17  1:43 PM  Result Value Ref Range   Creatinine, Urine 141.00 mg/dL   Total Protein, Urine 49 mg/dL   Protein Creatinine Ratio 0.35 (H) 0.00 - 0.15 mg/mg[Cre]  CBC     Status: Abnormal   Collection Time: 05/07/17  2:39 PM  Result Value Ref Range   WBC 9.6 4.0 - 10.5 K/uL   RBC 3.94 3.87 - 5.11 MIL/uL   Hemoglobin 12.3 12.0 - 15.0 g/dL   HCT 35.9 (L) 36.0 - 46.0 %   MCV 91.1 78.0 - 100.0 fL   MCH 31.2 26.0 - 34.0 pg   MCHC 34.3 30.0 - 36.0 g/dL   RDW 13.7 11.5 - 15.5 %   Platelets 239 150 - 400 K/uL  Comprehensive metabolic panel     Status: Abnormal   Collection Time: 05/07/17  3:19 PM  Result Value Ref Range   Sodium 136 135 - 145 mmol/L   Potassium 3.9 3.5 - 5.1 mmol/L   Chloride 105 101 - 111 mmol/L   CO2 23 22 - 32 mmol/L   Glucose, Bld 103 (H) 65 - 99 mg/dL   BUN 12 6 - 20 mg/dL   Creatinine, Ser 0.61 0.44 - 1.00 mg/dL   Calcium 9.1 8.9 - 10.3 mg/dL   Total Protein 7.0 6.5 - 8.1 g/dL   Albumin 3.1 (L) 3.5 - 5.0 g/dL   AST 185 (H) 15 - 41 U/L   ALT 477 (H) 14 - 54 U/L   Alkaline Phosphatase 204 (H) 38 - 126 U/L   Total Bilirubin 0.6 0.3 - 1.2 mg/dL   GFR calc non Af Amer >60 >60 mL/min   GFR calc Af Amer >60 >60 mL/min   Anion gap 8 5 - 15  Type and screen [place]     Status: None   Collection Time: 05/07/17  5:36 PM  Result Value Ref Range   ABO/RH(D) O POS    Antibody Screen NEG    Sample Expiration 05/10/2017   ABO/Rh     Status: None   Collection Time: 05/07/17  5:36 PM  Result Value  Ref Range   ABO/RH(D) O POS   Comprehensive metabolic panel     Status: Abnormal   Collection Time: 05/08/17  5:40 AM  Result Value Ref Range   Sodium 137 135 - 145 mmol/L   Potassium 3.6 3.5 - 5.1 mmol/L   Chloride 108 101 - 111 mmol/L   CO2 22 22 - 32 mmol/L   Glucose, Bld 88 65 - 99 mg/dL   BUN 15 6 - 20 mg/dL   Creatinine, Ser 0.51 0.44 - 1.00 mg/dL   Calcium 8.8 (L) 8.9 - 10.3 mg/dL   Total Protein 5.5 (L) 6.5 - 8.1 g/dL   Albumin 2.5 (L) 3.5 - 5.0 g/dL   AST 164 (H) 15 - 41 U/L   ALT 450 (H) 14 - 54 U/L   Alkaline Phosphatase 188 (H) 38 - 126  U/L   Total Bilirubin 0.7 0.3 - 1.2 mg/dL   GFR calc non Af Amer >60 >60 mL/min   GFR calc Af Amer >60 >60 mL/min   Anion gap 7 5 - 15  Uric acid     Status: None   Collection Time: 05/08/17  5:40 AM  Result Value Ref Range   Uric Acid, Serum 3.5 2.3 - 6.6 mg/dL  CBC     Status: Abnormal   Collection Time: 05/08/17  5:40 AM  Result Value Ref Range   WBC 8.7 4.0 - 10.5 K/uL   RBC 3.73 (L) 3.87 - 5.11 MIL/uL   Hemoglobin 11.7 (L) 12.0 - 15.0 g/dL   HCT 34.5 (L) 36.0 - 46.0 %   MCV 92.5 78.0 - 100.0 fL   MCH 31.4 26.0 - 34.0 pg   MCHC 33.9 30.0 - 36.0 g/dL   RDW 13.7 11.5 - 15.5 %   Platelets 206 150 - 400 K/uL  APTT     Status: None   Collection Time: 05/08/17  5:40 AM  Result Value Ref Range   aPTT 24 24 - 36 seconds  Protime-INR     Status: None   Collection Time: 05/08/17  5:40 AM  Result Value Ref Range   Prothrombin Time 12.0 11.4 - 15.2 seconds   INR 0.89   Fibrinogen     Status: Abnormal   Collection Time: 05/08/17  5:40 AM  Result Value Ref Range   Fibrinogen 596 (H) 210 - 475 mg/dL   Tel conversation with Dr Havery Moros, patient's primary GI He will see her later today  A/P: 30 1/7 weeks         Elevated LFT-unclear etiology, at this point does not appear to be HELLP         Fetal well being         Will check labs, continue 24 hour urine collection         Dr Havery Moros to see patient

## 2017-05-09 DIAGNOSIS — Z3A30 30 weeks gestation of pregnancy: Secondary | ICD-10-CM | POA: Diagnosis not present

## 2017-05-09 DIAGNOSIS — K831 Obstruction of bile duct: Secondary | ICD-10-CM

## 2017-05-09 DIAGNOSIS — O26619 Liver and biliary tract disorders in pregnancy, unspecified trimester: Secondary | ICD-10-CM | POA: Diagnosis not present

## 2017-05-09 DIAGNOSIS — O26613 Liver and biliary tract disorders in pregnancy, third trimester: Secondary | ICD-10-CM | POA: Diagnosis not present

## 2017-05-09 DIAGNOSIS — R748 Abnormal levels of other serum enzymes: Secondary | ICD-10-CM | POA: Diagnosis not present

## 2017-05-09 LAB — COMPREHENSIVE METABOLIC PANEL
ALBUMIN: 2.8 g/dL — AB (ref 3.5–5.0)
ALK PHOS: 185 U/L — AB (ref 38–126)
ALT: 655 U/L — AB (ref 14–54)
AST: 271 U/L — AB (ref 15–41)
Anion gap: 8 (ref 5–15)
BUN: 9 mg/dL (ref 6–20)
CALCIUM: 8.6 mg/dL — AB (ref 8.9–10.3)
CHLORIDE: 105 mmol/L (ref 101–111)
CO2: 21 mmol/L — AB (ref 22–32)
CREATININE: 0.47 mg/dL (ref 0.44–1.00)
GFR calc non Af Amer: >60 mL/min (ref >60)
GLUCOSE: 79 mg/dL (ref 65–99)
Potassium: 3.5 mmol/L (ref 3.5–5.1)
SODIUM: 134 mmol/L — AB (ref 135–145)
Total Bilirubin: 1.1 mg/dL (ref 0.3–1.2)
Total Protein: 6.2 g/dL — ABNORMAL LOW (ref 6.5–8.1)

## 2017-05-09 LAB — URIC ACID: Uric Acid, Serum: 3.4 mg/dL (ref 2.3–6.6)

## 2017-05-09 LAB — CBC
HCT: 34.3 % — ABNORMAL LOW (ref 36.0–46.0)
HEMOGLOBIN: 11.9 g/dL — AB (ref 12.0–15.0)
MCH: 31.3 pg (ref 26.0–34.0)
MCHC: 34.7 g/dL (ref 30.0–36.0)
MCV: 90.3 fL (ref 78.0–100.0)
PLATELETS: 225 10*3/uL (ref 150–400)
RBC: 3.8 MIL/uL — ABNORMAL LOW (ref 3.87–5.11)
RDW: 13.6 % (ref 11.5–15.5)
WBC: 10.4 10*3/uL (ref 4.0–10.5)

## 2017-05-09 LAB — PROTIME-INR
INR: 0.86
PROTHROMBIN TIME: 11.8 s (ref 11.4–15.2)

## 2017-05-09 LAB — HEPATITIS PANEL, ACUTE
HCV Ab: <0.1 s/co ratio (ref 0.0–0.9)
HEP B C IGM: NEGATIVE
HEP B S AG: NEGATIVE
Hep A IgM: NEGATIVE

## 2017-05-09 MED ORDER — URSODIOL 300 MG PO CAPS
300.0000 mg | ORAL_CAPSULE | Freq: Three times a day (TID) | ORAL | Status: DC
  Administered 2017-05-09 – 2017-05-11 (×7): 300 mg via ORAL
  Filled 2017-05-09 (×10): qty 1

## 2017-05-09 NOTE — Progress Notes (Signed)
30 2/7 weeks  Mild itching continues, no HA or vision change, no epigastric pain  Vitals:   05/09/17 0600 05/09/17 0845  BP: 119/70 116/70  Pulse: 93 (!) 101  Resp: 16 16  Temp: 98.2 F (36.8 C) 98 F (36.7 C)   NST reactive  Results for orders placed or performed during the hospital encounter of 05/07/17 (from the past 24 hour(s))  Hepatitis panel, acute     Status: None   Collection Time: 05/08/17 10:43 AM  Result Value Ref Range   Hepatitis B Surface Ag Negative Negative   HCV Ab <0.1 0.0 - 0.9 s/co ratio   Hep A IgM Negative Negative   Hep B C IgM Negative Negative  Mononucleosis screen     Status: None   Collection Time: 05/08/17 10:43 AM  Result Value Ref Range   Mono Screen NEGATIVE NEGATIVE  Protime-INR     Status: None   Collection Time: 05/09/17  5:32 AM  Result Value Ref Range   Prothrombin Time 11.8 11.4 - 15.2 seconds   INR 0.86   CBC     Status: Abnormal   Collection Time: 05/09/17  5:32 AM  Result Value Ref Range   WBC 10.4 4.0 - 10.5 K/uL   RBC 3.80 (L) 3.87 - 5.11 MIL/uL   Hemoglobin 11.9 (L) 12.0 - 15.0 g/dL   HCT 34.3 (L) 36.0 - 46.0 %   MCV 90.3 78.0 - 100.0 fL   MCH 31.3 26.0 - 34.0 pg   MCHC 34.7 30.0 - 36.0 g/dL   RDW 13.6 11.5 - 15.5 %   Platelets 225 150 - 400 K/uL  Comprehensive metabolic panel     Status: Abnormal   Collection Time: 05/09/17  5:32 AM  Result Value Ref Range   Sodium 134 (L) 135 - 145 mmol/L   Potassium 3.5 3.5 - 5.1 mmol/L   Chloride 105 101 - 111 mmol/L   CO2 21 (L) 22 - 32 mmol/L   Glucose, Bld 79 65 - 99 mg/dL   BUN 9 6 - 20 mg/dL   Creatinine, Ser 0.47 0.44 - 1.00 mg/dL   Calcium 8.6 (L) 8.9 - 10.3 mg/dL   Total Protein 6.2 (L) 6.5 - 8.1 g/dL   Albumin 2.8 (L) 3.5 - 5.0 g/dL   AST 271 (H) 15 - 41 U/L   ALT 655 (H) 14 - 54 U/L   Alkaline Phosphatase 185 (H) 38 - 126 U/L   Total Bilirubin 1.1 0.3 - 1.2 mg/dL   GFR calc non Af Amer >60 >60 mL/min   GFR calc Af Amer >60 >60 mL/min   Anion gap 8 5 - 15  Uric  acid     Status: None   Collection Time: 05/09/17  5:32 AM  Result Value Ref Range   Uric Acid, Serum 3.4 2.3 - 6.6 mg/dL   Bile acid = 160  Appreciate Dr Doyne Keel consultation  A/P: Intrahepatic cholestasis of pregnancy         No evidence at this time for HELLP         FWB +         D/W patient increased risks to pregnancy with IHC including IUFD, need for close pregnancy surveillance at least twice/week          Will check labs tomorrow and possible D/C pending Dr Doyne Keel recommendation          MFM consult this week         Hepatology consult  per Dr Havery Moros         Ursodiol 300mg  TID         Would repeat bile acids in 2-3 weeks

## 2017-05-09 NOTE — Progress Notes (Signed)
Progress Note   Subjective  Patient feels the same - essentially no significant complaints, mild pruritus. Had [place] yesterday and additional labs as below. No abdominal pains, eating well.    Objective   Vital signs in last 24 hours: Temp:  [97.7 F (36.5 C)-98.2 F (36.8 C)] 98.2 F (36.8 C) (05/27 0600) Pulse Rate:  [89-93] 93 (05/27 0600) Resp:  [16-18] 16 (05/27 0600) BP: (110-122)/(65-78) 119/70 (05/27 0600) SpO2:  [99 %-100 %] 100 % (05/26 2242) Weight:  [144 lb 0.6 oz (65.3 kg)] 144 lb 0.6 oz (65.3 kg) (05/27 0500)   General:    white female in NAD Heart:  Regular rate and rhythm; no murmurs Lungs: Respirations even and unlabored, lungs CTA bilaterally Abdomen:  Soft, nontender. Gravid uterus.  Extremities:  Without edema. No rash Neurologic:  Alert and oriented,  grossly normal neurologically. Psych:  Cooperative. Normal mood and affect.  Intake/Output from previous day: 05/26 0701 - 05/27 0700 In: 240 [P.O.:240] Out: 4970 [Urine:1375] Intake/Output this shift: No intake/output data recorded.  Lab Results:  Recent Labs  05/07/17 1439 05/08/17 0540 05/09/17 0532  WBC 9.6 8.7 10.4  HGB 12.3 11.7* 11.9*  HCT 35.9* 34.5* 34.3*  PLT 239 206 225   BMET  Recent Labs  05/07/17 1519 05/08/17 0540 05/09/17 0532  NA 136 137 134*  K 3.9 3.6 3.5  CL 105 108 105  CO2 23 22 21*  GLUCOSE 103* 88 79  BUN 12 15 9   CREATININE 0.61 0.51 0.47  CALCIUM 9.1 8.8* 8.6*   LFT  Recent Labs  05/09/17 0532  PROT 6.2*  ALBUMIN 2.8*  AST 271*  ALT 655*  ALKPHOS 185*  BILITOT 1.1   PT/INR  Recent Labs  05/08/17 0540 05/09/17 0532  LABPROT 12.0 11.8  INR 0.89 0.86    Studies/Results: [place] Art/ven Flow Abd Pelv Doppler  Result Date: 05/08/2017 CLINICAL DATA:  Patient is [redacted] weeks pregnant, now with abnormal LFTs. Please evaluate hepatic vascular system. EXAM: DUPLEX ULTRASOUND OF LIVER TECHNIQUE: Color and duplex Doppler ultrasound was performed to  evaluate the hepatic in-flow and out-flow vessels. COMPARISON:  None. Abdominal ultrasound - 07/19/2015; ultrasound-guided random liver biopsy - 05/13/2016 FINDINGS: Gallbladder: Several large gallstones and echogenic sludge are noted within otherwise normal appearing gallbladder. No gallbladder wall thickening or pericholecystic fluid. Negative sonographic Murphy's sign. Common bile duct: Diameter: Normal in size measuring 4.3 mm in diameter Liver: Note is made of a punctate (approximately 0.4 cm) echogenic calcification/granuloma within the right lobe of the liver. Otherwise, normal sonographic appearance of the liver. No additional worrisome hepatic lesions. No intrahepatic biliary ductal dilatation. No ascites. Portal Vein: The main, right and left portal veins are widely patent with normal directional flow. Hepatic Vein: The right, middle and left hepatic veins are widely patent with normal directional flow. Normal low resistance waveforms are demonstrated throughout the interrogated portion of the hepatic artery. The IVC appears widely patent were imaged. Varices: None visualized. Ascites: None visualized. IMPRESSION: 1. Extensive cholelithiasis and biliary sludge, progressed compared to the 07/2015 examination, without evidence of acute cholecystitis. 2. No definite explanation for patient's abnormal LFTs. Specifically, hepatic vascular system appears widely patent with normal directional flow. No evidence of intra or extrahepatic biliary duct dilatation. No definitive evidence of choledocholithiasis. Further evaluation with MRCP could be performed as indicated. 3. Punctate (approximately 0.4 cm) suspected granuloma within the right lobe of the liver. Electronically Signed   By: Eldridge Abrahams.D.  On: 05/08/2017 13:44   US Abdomen Limited Ruq  Result Date: 05/08/2017 CLINICAL DATA:  Patient is [redacted] weeks pregnant, now with abnormal LFTs. Please evaluate hepatic vascular system. EXAM: DUPLEX ULTRASOUND OF  LIVER TECHNIQUE: Color and duplex Doppler ultrasound was performed to evaluate the hepatic in-flow and out-flow vessels. COMPARISON:  None. Abdominal ultrasound - 07/19/2015; ultrasound-guided random liver biopsy - 05/13/2016 FINDINGS: Gallbladder: Several large gallstones and echogenic sludge are noted within otherwise normal appearing gallbladder. No gallbladder wall thickening or pericholecystic fluid. Negative sonographic Murphy's sign. Common bile duct: Diameter: Normal in size measuring 4.3 mm in diameter Liver: Note is made of a punctate (approximately 0.4 cm) echogenic calcification/granuloma within the right lobe of the liver. Otherwise, normal sonographic appearance of the liver. No additional worrisome hepatic lesions. No intrahepatic biliary ductal dilatation. No ascites. Portal Vein: The main, right and left portal veins are widely patent with normal directional flow. Hepatic Vein: The right, middle and left hepatic veins are widely patent with normal directional flow. Normal low resistance waveforms are demonstrated throughout the interrogated portion of the hepatic artery. The IVC appears widely patent were imaged. Varices: None visualized. Ascites: None visualized. IMPRESSION: 1. Extensive cholelithiasis and biliary sludge, progressed compared to the 07/2015 examination, without evidence of acute cholecystitis. 2. No definite explanation for patient's abnormal LFTs. Specifically, hepatic vascular system appears widely patent with normal directional flow. No evidence of intra or extrahepatic biliary duct dilatation. No definitive evidence of choledocholithiasis. Further evaluation with MRCP could be performed as indicated. 3. Punctate (approximately 0.4 cm) suspected granuloma within the right lobe of the liver. Electronically Signed   By: Sandi Mariscal M.D.   On: 05/08/2017 13:44       Assessment / Plan:   31 y/o female with a remote acute liver injury in 2016, with mild chronic elevation in ALT  since that time s/p extensive negative serologic workup and liver biopsy which was normal in 2017, now [redacted] weeks pregnant presenting with elevation in ALT > AST and mild pruritus.  See initial consult note for full discussion of her case. [place] with doppler yesterday showed normal parenchyma and no clot. She does have gallstones but the CBD is normal and she has no pain at all, I think choledocholithiasis is very unlikely at this time, do not think MRCP is warranted at present.   ALT has risen to 600s today. 24 HR urine protein does not appear significant, she does not have HELLP syndrome. She does not meet criteria for acute fatty liver of pregnancy and this seems very unlikely, and reassured the patient in this light.  Acute viral serologies pending, but bile acid level is markedly elevated to 160. Given constellation of findings thus far, she likely has intrahepatic cholestasis of pregnancy.   We discussed what this is, potential risks for the fetus with bile acid level this high, and treatment. Recommend starting Ursodiol now, but would await some of the other viral serologies to return and ensure negative prior to discharge as her ALT is rising. It may take several days / few weeks to notice significant change in LAEs if this is intrahepatic cholestasis of pregnancy.   Recommend the following at this time: - start Ursodiol 10-15 mg/kg/day at this time, it should be continued up until delivery - await viral serologies, repeat LFTs tomorrow AM - early delivery around 36 weeks if she has intrahepatic cholestasis of pregnancy, will discuss with Dr. Gaetano Net - after her eventual discharge from the hospital, she would warrant  close monitoring of LFTs and formal Hepatology consultation for further evaluation. I have discussed her case with a Hepatologist over the phone this weekend who agreed with management thus far.  Please call with questions. I will continue to follow closely.  Laurence Harbor Cellar,  MD [facility] Pager [phone]

## 2017-05-09 NOTE — Progress Notes (Signed)
At 2025 hr 24 hour urine collection was sent to the lab.

## 2017-05-10 ENCOUNTER — Other Ambulatory Visit: Payer: Self-pay | Admitting: Gastroenterology

## 2017-05-10 DIAGNOSIS — R748 Abnormal levels of other serum enzymes: Secondary | ICD-10-CM | POA: Diagnosis not present

## 2017-05-10 DIAGNOSIS — K831 Obstruction of bile duct: Secondary | ICD-10-CM | POA: Diagnosis present

## 2017-05-10 DIAGNOSIS — O26619 Liver and biliary tract disorders in pregnancy, unspecified trimester: Secondary | ICD-10-CM

## 2017-05-10 DIAGNOSIS — O26613 Liver and biliary tract disorders in pregnancy, third trimester: Secondary | ICD-10-CM | POA: Diagnosis not present

## 2017-05-10 DIAGNOSIS — O26649 Intrahepatic cholestasis of pregnancy, unspecified trimester: Secondary | ICD-10-CM | POA: Diagnosis present

## 2017-05-10 DIAGNOSIS — Z3A30 30 weeks gestation of pregnancy: Secondary | ICD-10-CM | POA: Diagnosis not present

## 2017-05-10 LAB — COMPREHENSIVE METABOLIC PANEL
ALBUMIN: 2.6 g/dL — AB (ref 3.5–5.0)
ALT: 660 U/L — ABNORMAL HIGH (ref 14–54)
ANION GAP: 6 (ref 5–15)
AST: 221 U/L — ABNORMAL HIGH (ref 15–41)
Alkaline Phosphatase: 179 U/L — ABNORMAL HIGH (ref 38–126)
BUN: 9 mg/dL (ref 6–20)
CO2: 21 mmol/L — AB (ref 22–32)
Calcium: 8.3 mg/dL — ABNORMAL LOW (ref 8.9–10.3)
Chloride: 109 mmol/L (ref 101–111)
Creatinine, Ser: 0.49 mg/dL (ref 0.44–1.00)
GFR calc non Af Amer: >60 mL/min (ref >60)
Glucose, Bld: 83 mg/dL (ref 65–99)
POTASSIUM: 3.7 mmol/L (ref 3.5–5.1)
SODIUM: 136 mmol/L (ref 135–145)
Total Bilirubin: 0.8 mg/dL (ref 0.3–1.2)
Total Protein: 6.1 g/dL — ABNORMAL LOW (ref 6.5–8.1)

## 2017-05-10 LAB — CBC
HCT: 34.8 % — ABNORMAL LOW (ref 36.0–46.0)
Hemoglobin: 11.8 g/dL — ABNORMAL LOW (ref 12.0–15.0)
MCH: 31 pg (ref 26.0–34.0)
MCHC: 33.9 g/dL (ref 30.0–36.0)
MCV: 91.3 fL (ref 78.0–100.0)
PLATELETS: 230 10*3/uL (ref 150–400)
RBC: 3.81 MIL/uL — ABNORMAL LOW (ref 3.87–5.11)
RDW: 13.7 % (ref 11.5–15.5)
WBC: 7.7 10*3/uL (ref 4.0–10.5)

## 2017-05-10 NOTE — Progress Notes (Signed)
S: Patient with without complaints today. She has minimal itching. She denies SOB/CP, HA, change in vision, LOF, ctxn, and VB. +FM.   O:  Vitals:   05/09/17 2006 05/10/17 0800  BP: 109/68 113/64  Pulse: 90 78  Resp: 16 16  Temp: 97.8 F (36.6 C) 97.8 F (36.6 C)   NAD, A&O Reg rate NWOB Abd soft, nondistended, gravid   A/P: 31yo G1P0 @ [redacted]w[redacted]d with likely intrahepatic cholestasis of pregnancy.   # Elevated LFTs: most c/w cholestasis - h/o idiopathic hepatitis now w/itching and elevated LFTs. She has had a hepatitis panel that is WNL, an US WNL, INR is WNL, and is being followed by her primary GI. - Patient understands risk of cholestasis including increased risk of IUFD and dev'p of PIH. Also discussed increased fetal surveillance including twice weekly NSTs - Cont Ursodiol 300mg  TID  - Cont daily labs - LFTs currently stable. ALT in 600s. AST now downtrending. BA elevated - appreciate Dr. Doyne Keel recs - hepatitis panel WNL, US WNL,  - MFM c/s ordered - if sees her today, she can likely be d/c home pending their final recs - repeat BAs 2-3 weeks - cont daily NSTs    Lucillie Garfinkel MD

## 2017-05-10 NOTE — Progress Notes (Signed)
Initial visit to introduce spiritual care services and offer support during pt's hospital stay. Pt and her spouse share that she is ready for discharge and just waiting for MFM to return from holiday tomorrow to examine their daughter before discharge.  They're in good spirits and grateful for the support.  Please page as further needs arise.  Donald Prose. Elyn Peers, M.Div. [facility] Chaplain Pager [phone] Office [phone]

## 2017-05-10 NOTE — Progress Notes (Signed)
Progress Note   Subjective  Patient feels well today. She denies abdominal pain. No complaints.    Objective   Vital signs in last 24 hours: Temp:  [97.6 F (36.4 C)-98 F (36.7 C)] 97.8 F (36.6 C) (05/28 0800) Pulse Rate:  [78-99] 78 (05/28 0800) Resp:  [16] 16 (05/28 0800) BP: (109-116)/(64-71) 113/64 (05/28 0800) SpO2:  [98 %-100 %] 100 % (05/28 0800) Last BM Date: 05/09/17 General:    white female in NAD Heart:  Regular rate and rhythm; no murmurs Lungs: Respirations even and unlabored, lungs CTA bilaterally Abdomen:  Soft, nontender, gravid uterus.  Extremities:  Without edema. Neurologic:  Alert and oriented,  grossly normal neurologically. Psych:  Cooperative. Normal mood and affect.  Intake/Output from previous day: No intake/output data recorded. Intake/Output this shift: No intake/output data recorded.  Lab Results:  Recent Labs  05/08/17 0540 05/09/17 0532 05/10/17 0546  WBC 8.7 10.4 7.7  HGB 11.7* 11.9* 11.8*  HCT 34.5* 34.3* 34.8*  PLT 206 225 230   BMET  Recent Labs  05/08/17 0540 05/09/17 0532 05/10/17 0546  NA 137 134* 136  K 3.6 3.5 3.7  CL 108 105 109  CO2 22 21* 21*  GLUCOSE 88 79 83  BUN 15 9 9   CREATININE 0.51 0.47 0.49  CALCIUM 8.8* 8.6* 8.3*   LFT  Recent Labs  05/10/17 0546  PROT 6.1*  ALBUMIN 2.6*  AST 221*  ALT 660*  ALKPHOS 179*  BILITOT 0.8   PT/INR  Recent Labs  05/08/17 0540 05/09/17 0532  LABPROT 12.0 11.8  INR 0.89 0.86    Studies/Results: [place] Art/ven Flow Abd Pelv Doppler  Result Date: 05/08/2017 CLINICAL DATA:  Patient is [redacted] weeks pregnant, now with abnormal LFTs. Please evaluate hepatic vascular system. EXAM: DUPLEX ULTRASOUND OF LIVER TECHNIQUE: Color and duplex Doppler ultrasound was performed to evaluate the hepatic in-flow and out-flow vessels. COMPARISON:  None. Abdominal ultrasound - 07/19/2015; ultrasound-guided random liver biopsy - 05/13/2016 FINDINGS: Gallbladder: Several large  gallstones and echogenic sludge are noted within otherwise normal appearing gallbladder. No gallbladder wall thickening or pericholecystic fluid. Negative sonographic Murphy's sign. Common bile duct: Diameter: Normal in size measuring 4.3 mm in diameter Liver: Note is made of a punctate (approximately 0.4 cm) echogenic calcification/granuloma within the right lobe of the liver. Otherwise, normal sonographic appearance of the liver. No additional worrisome hepatic lesions. No intrahepatic biliary ductal dilatation. No ascites. Portal Vein: The main, right and left portal veins are widely patent with normal directional flow. Hepatic Vein: The right, middle and left hepatic veins are widely patent with normal directional flow. Normal low resistance waveforms are demonstrated throughout the interrogated portion of the hepatic artery. The IVC appears widely patent were imaged. Varices: None visualized. Ascites: None visualized. IMPRESSION: 1. Extensive cholelithiasis and biliary sludge, progressed compared to the 07/2015 examination, without evidence of acute cholecystitis. 2. No definite explanation for patient's abnormal LFTs. Specifically, hepatic vascular system appears widely patent with normal directional flow. No evidence of intra or extrahepatic biliary duct dilatation. No definitive evidence of choledocholithiasis. Further evaluation with MRCP could be performed as indicated. 3. Punctate (approximately 0.4 cm) suspected granuloma within the right lobe of the liver. Electronically Signed   By: Sandi Mariscal M.D.   On: 05/08/2017 13:44   US Abdomen Limited Ruq  Result Date: 05/08/2017 CLINICAL DATA:  Patient is [redacted] weeks pregnant, now with abnormal LFTs. Please evaluate hepatic vascular system. EXAM: DUPLEX ULTRASOUND OF LIVER TECHNIQUE: Color  and duplex Doppler ultrasound was performed to evaluate the hepatic in-flow and out-flow vessels. COMPARISON:  None. Abdominal ultrasound - 07/19/2015; ultrasound-guided  random liver biopsy - 05/13/2016 FINDINGS: Gallbladder: Several large gallstones and echogenic sludge are noted within otherwise normal appearing gallbladder. No gallbladder wall thickening or pericholecystic fluid. Negative sonographic Murphy's sign. Common bile duct: Diameter: Normal in size measuring 4.3 mm in diameter Liver: Note is made of a punctate (approximately 0.4 cm) echogenic calcification/granuloma within the right lobe of the liver. Otherwise, normal sonographic appearance of the liver. No additional worrisome hepatic lesions. No intrahepatic biliary ductal dilatation. No ascites. Portal Vein: The main, right and left portal veins are widely patent with normal directional flow. Hepatic Vein: The right, middle and left hepatic veins are widely patent with normal directional flow. Normal low resistance waveforms are demonstrated throughout the interrogated portion of the hepatic artery. The IVC appears widely patent were imaged. Varices: None visualized. Ascites: None visualized. IMPRESSION: 1. Extensive cholelithiasis and biliary sludge, progressed compared to the 07/2015 examination, without evidence of acute cholecystitis. 2. No definite explanation for patient's abnormal LFTs. Specifically, hepatic vascular system appears widely patent with normal directional flow. No evidence of intra or extrahepatic biliary duct dilatation. No definitive evidence of choledocholithiasis. Further evaluation with MRCP could be performed as indicated. 3. Punctate (approximately 0.4 cm) suspected granuloma within the right lobe of the liver. Electronically Signed   By: Sandi Mariscal M.D.   On: 05/08/2017 13:44       Assessment / Plan:   31 y/o female, [redacted] weeks pregnant, admitted with elevation in ALT > AST. Please see yesterday's note for full details / differential for her case and workup thus far.   Since I have last seen her her acute hepatitis panel is negative and reassuring. Labs for HSV and hepatitis E  remain pending (both very unlikely), and autoimmune serologies remain pending, will not likely be back for a few days but again seems less likely.   Given constellation of findings with marked elevation in bile acids and mild pruritus, she likely has intrahepatic cholestasis of pregnancy, which I discussed in detail with her. We have started Ursodiol dosed 10-15mg /kg/day. Liver enzymes can take several days / weeks to make significant changes.   ALT is roughly the same as yesterday, AST is downtrending. INR has been normal.   Recommend the following at this time: - I think the patient is safe to be discharged home today with close outpatient follow. Would recommend repeating LFTs and INR on Wed (48 hours) for reassessment, I will order this for her - continue Ursodiol 300mg  TID, it should be continued up to delivery - await pending serologies  - our office will coordinate outpatient consultation with Hepatology to be done in the near future. I have discussed her case with a Hepatologist over the phone this weekend. - early delivery around 68 weeks is typically recommended for intrahepatic cholestasis of pregnancy, will await MFM consultation  Please call with questions.  Pipestone Cellar, MD [facility] Pager [phone]

## 2017-05-11 ENCOUNTER — Observation Stay (HOSPITAL_COMMUNITY): Payer: 59

## 2017-05-11 ENCOUNTER — Telehealth: Payer: Self-pay

## 2017-05-11 ENCOUNTER — Ambulatory Visit (HOSPITAL_COMMUNITY): Admission: RE | Admit: 2017-05-11 | Payer: 59 | Source: Ambulatory Visit

## 2017-05-11 DIAGNOSIS — O26613 Liver and biliary tract disorders in pregnancy, third trimester: Secondary | ICD-10-CM | POA: Diagnosis not present

## 2017-05-11 DIAGNOSIS — K831 Obstruction of bile duct: Secondary | ICD-10-CM | POA: Diagnosis not present

## 2017-05-11 DIAGNOSIS — R748 Abnormal levels of other serum enzymes: Secondary | ICD-10-CM | POA: Diagnosis not present

## 2017-05-11 DIAGNOSIS — Z3A30 30 weeks gestation of pregnancy: Secondary | ICD-10-CM | POA: Diagnosis not present

## 2017-05-11 LAB — EBV AB TO VIRAL CAPSID AG PNL, IGG+IGM: EBV VCA IgM: <36 U/mL (ref 0.0–35.9)

## 2017-05-11 LAB — COMPREHENSIVE METABOLIC PANEL
ALBUMIN: 2.6 g/dL — AB (ref 3.5–5.0)
ALT: 613 U/L — ABNORMAL HIGH (ref 14–54)
AST: 161 U/L — ABNORMAL HIGH (ref 15–41)
Alkaline Phosphatase: 177 U/L — ABNORMAL HIGH (ref 38–126)
Anion gap: 6 (ref 5–15)
BUN: 9 mg/dL (ref 6–20)
CHLORIDE: 107 mmol/L (ref 101–111)
CO2: 22 mmol/L (ref 22–32)
Calcium: 8.7 mg/dL — ABNORMAL LOW (ref 8.9–10.3)
Creatinine, Ser: 0.52 mg/dL (ref 0.44–1.00)
GFR calc Af Amer: >60 mL/min (ref >60)
GFR calc non Af Amer: >60 mL/min (ref >60)
GLUCOSE: 81 mg/dL (ref 65–99)
POTASSIUM: 3.9 mmol/L (ref 3.5–5.1)
SODIUM: 135 mmol/L (ref 135–145)
Total Bilirubin: 0.6 mg/dL (ref 0.3–1.2)
Total Protein: 6 g/dL — ABNORMAL LOW (ref 6.5–8.1)

## 2017-05-11 LAB — ANTI-SMOOTH MUSCLE ANTIBODY, IGG: F-ACTIN AB IGG: 4 U (ref 0–19)

## 2017-05-11 LAB — HSV(HERPES SIMPLEX VRS) I + II AB-IGM: HSVI/II Comb IgM: <0.91 Ratio (ref 0.00–0.90)

## 2017-05-11 MED ORDER — URSODIOL 300 MG PO CAPS: 300.0000 mg | ORAL_CAPSULE | Freq: Three times a day (TID) | ORAL | 2 refills | Status: DC

## 2017-05-11 MED FILL — URSODIOL 300 MG CAPSULE: 300 | 30 days supply | Qty: 90 | Fill #0

## 2017-05-11 NOTE — Telephone Encounter (Signed)
-----   Message from Manus Gunning, MD sent at 05/10/2017 10:39 AM EDT ----- Regarding: Hepatology consult Hi Ronie, Fleeger got admitted for rising LFTs this weekend and I consulted on her case. I think she has intrahepatic cholestasis of pregnancy but would like her to see Hepatology as soon as possible for a consultation. [facility] would [facility] if [facility] can get her in within 2 weeks. If not, please let me know and would consider [facility] or [facility].  I've ordered her to have follow up LFTs and INR on Wed of this week.   Thanks

## 2017-05-11 NOTE — Progress Notes (Signed)
S: no complaints, awaiting MFM consult, desires discharge. Minimal itching. + FM O: VSS, Abd - gravid uterus,soft, non - tender, no RUQ tenderness Labs stable- alk phosphatase - 177  ALT- 613  AST- 161  A: 30 4/[redacted] weeks gestation with intrahepatic cholestasis of pregnancy P: probable discharge today Plan to follow up in office Thursday with NST Labs are scheduled with GI office on Wednesday

## 2017-05-11 NOTE — Telephone Encounter (Signed)
Faxed over referral information to [facility], attn: Roosevelt Locks, NP.

## 2017-05-11 NOTE — Consult Note (Signed)
Requested to see the patient and review hospital course and current treatment plans. She has seen the GI attending and is scheduled to see the hepatologist sometime in the next week. She has had a full workup by GI and no other diagnosis besides ICP has been found. There is no evidence of preeclampsia LFTs remain elevated, with AST trending down. Bile acids are quite elevated at 160. She reports minimal itching to start with, and minimal improvement on the ursodiol. All antepartum testing has been reassuring. I agree with the plan for discharge today with hepatologist follow up. I am concerned about the history of hepatits in 2016 with no diagnosis found, in conjunction with the ICP.  I agree with the plans for antepartum testing. I would suggest a weekly BPP and a weekly NST, either on a Monday/Thursday or a Tuesday/Friday schedule I would plan delivery as soon after 37 weeks as is practical. This might need to occur earlier if LFTs begin to trend upward again.   Thank you for allowing me to participate in your patient's care. I spent approximately 15 minutes with the patient, >50% of which was spent in counseling

## 2017-05-11 NOTE — Discharge Summary (Signed)
Physician Discharge Summary  Patient ID: Kathy Blackwell MRN: [id number] DOB/AGE: 1986/09/02 31 y.o.  Admit date: 05/07/2017 Discharge date: 05/11/2017  Admission Diagnoses:[redacted]w[redacted]d, cholestasis of pregnancy   Discharge Diagnoses:  Active Problems:   Elevated liver enzymes   Cholestasis during pregnancy   Cholestasis of pregnancy   Discharged Condition: good  Hospital Course: adm for further eval by OB+GI+ MFM, started on urosdiol with improvement in pruritis, with stagble BP and nl fetal health assessment  Consults: GI and MFM  Significant Diagnostic Studies: labs:  Results for orders placed or performed during the hospital encounter of 05/07/17 (from the past 24 hour(s))  Comprehensive metabolic panel     Status: Abnormal   Collection Time: 05/11/17  5:16 AM  Result Value Ref Range   Sodium 135 135 - 145 mmol/L   Potassium 3.9 3.5 - 5.1 mmol/L   Chloride 107 101 - 111 mmol/L   CO2 22 22 - 32 mmol/L   Glucose, Bld 81 65 - 99 mg/dL   BUN 9 6 - 20 mg/dL   Creatinine, Ser 0.52 0.44 - 1.00 mg/dL   Calcium 8.7 (L) 8.9 - 10.3 mg/dL   Total Protein 6.0 (L) 6.5 - 8.1 g/dL   Albumin 2.6 (L) 3.5 - 5.0 g/dL   AST 161 (H) 15 - 41 U/L   ALT 613 (H) 14 - 54 U/L   Alkaline Phosphatase 177 (H) 38 - 126 U/L   Total Bilirubin 0.6 0.3 - 1.2 mg/dL   GFR calc non Af Amer >60 >60 mL/min   GFR calc Af Amer >60 >60 mL/min   Anion gap 6 5 - 15    Treatments: IV hydration and &Ursodiol  Discharge Exam: Blood pressure 112/75, pulse 87, temperature 98.1 F (36.7 C), temperature source Oral, resp. rate 18, height 5\' 3"  (1.6 m), weight 144 lb 0.6 oz (65.3 kg), SpO2 100 %. no c/o HA or visual changes,, good FM, abd >>fundus soft, nontender  Disposition: 01-Home or Self Care  HAS APPT WITH GI TOMORROW FOR REPEAT LABS   Allergies as of 05/11/2017   No Known Allergies     Medication List    TAKE these medications   prenatal multivitamin Tabs tablet Take 1 tablet by mouth daily at 12  noon.   ursodiol 300 MG capsule Commonly known as:  ACTIGALL Take 1 capsule (300 mg total) by mouth 3 (three) times daily.      Follow-up Information    [place] Follow up.   Why:  has appt for [employer] information: [address] [id number] [phone]           Signed: Margarette Asal 05/11/2017, 1:49 PM

## 2017-05-11 NOTE — Progress Notes (Signed)
Discharge instructions given to patient. Patient verbalized an understanding of discharge instructions. Discharged via wheelchair with family.

## 2017-05-12 ENCOUNTER — Other Ambulatory Visit: Payer: Self-pay | Admitting: Gastroenterology

## 2017-05-12 ENCOUNTER — Other Ambulatory Visit (INDEPENDENT_AMBULATORY_CARE_PROVIDER_SITE_OTHER): Payer: 59

## 2017-05-12 DIAGNOSIS — K831 Obstruction of bile duct: Secondary | ICD-10-CM

## 2017-05-12 DIAGNOSIS — O26619 Liver and biliary tract disorders in pregnancy, unspecified trimester: Secondary | ICD-10-CM | POA: Diagnosis not present

## 2017-05-12 DIAGNOSIS — O26613 Liver and biliary tract disorders in pregnancy, third trimester: Principal | ICD-10-CM

## 2017-05-12 LAB — HEPATIC FUNCTION PANEL
ALBUMIN: 3.3 g/dL — AB (ref 3.5–5.2)
ALK PHOS: 191 U/L — AB (ref 39–117)
ALT: 537 U/L — ABNORMAL HIGH (ref 0–35)
AST: 135 U/L — AB (ref 0–37)
Bilirubin, Direct: 0.2 mg/dL (ref 0.0–0.3)
Total Bilirubin: 0.6 mg/dL (ref 0.2–1.2)
Total Protein: 6.4 g/dL (ref 6.0–8.3)

## 2017-05-12 LAB — ANTI-MICROSOMAL ANTIBODY LIVER / KIDNEY: LKM1 Ab: 2.5 Units (ref 0.0–20.0)

## 2017-05-12 LAB — PROTIME-INR
INR: 0.9 ratio (ref 0.8–1.0)
Prothrombin Time: 9.6 s (ref 9.6–13.1)

## 2017-05-12 LAB — IGG: IgG (Immunoglobin G), Serum: 472 mg/dL — ABNORMAL LOW (ref 700–1600)

## 2017-05-13 DIAGNOSIS — O9989 Other specified diseases and conditions complicating pregnancy, childbirth and the puerperium: Secondary | ICD-10-CM | POA: Diagnosis not present

## 2017-05-13 DIAGNOSIS — Z3400 Encounter for supervision of normal first pregnancy, unspecified trimester: Secondary | ICD-10-CM | POA: Diagnosis not present

## 2017-05-13 DIAGNOSIS — Z3A30 30 weeks gestation of pregnancy: Secondary | ICD-10-CM | POA: Diagnosis not present

## 2017-05-15 LAB — MISC LABCORP TEST (SEND OUT): Labcorp test code: 82186

## 2017-05-17 ENCOUNTER — Other Ambulatory Visit (INDEPENDENT_AMBULATORY_CARE_PROVIDER_SITE_OTHER): Payer: 59

## 2017-05-17 DIAGNOSIS — O26613 Liver and biliary tract disorders in pregnancy, third trimester: Secondary | ICD-10-CM | POA: Diagnosis not present

## 2017-05-17 DIAGNOSIS — K831 Obstruction of bile duct: Secondary | ICD-10-CM

## 2017-05-17 DIAGNOSIS — Z3400 Encounter for supervision of normal first pregnancy, unspecified trimester: Secondary | ICD-10-CM | POA: Diagnosis not present

## 2017-05-17 DIAGNOSIS — O133 Gestational [pregnancy-induced] hypertension without significant proteinuria, third trimester: Secondary | ICD-10-CM | POA: Diagnosis not present

## 2017-05-17 DIAGNOSIS — Z3A31 31 weeks gestation of pregnancy: Secondary | ICD-10-CM | POA: Diagnosis not present

## 2017-05-17 LAB — HEPATIC FUNCTION PANEL
ALT: 490 U/L — AB (ref 0–35)
AST: 128 U/L — AB (ref 0–37)
Albumin: 3.2 g/dL — ABNORMAL LOW (ref 3.5–5.2)
Alkaline Phosphatase: 162 U/L — ABNORMAL HIGH (ref 39–117)
BILIRUBIN TOTAL: 0.8 mg/dL (ref 0.2–1.2)
Bilirubin, Direct: 0.4 mg/dL — ABNORMAL HIGH (ref 0.0–0.3)
Total Protein: 6.3 g/dL (ref 6.0–8.3)

## 2017-05-17 LAB — PROTIME-INR
INR: 0.9 ratio (ref 0.8–1.0)
Prothrombin Time: 9.3 s — ABNORMAL LOW (ref 9.6–13.1)

## 2017-05-18 ENCOUNTER — Inpatient Hospital Stay (HOSPITAL_COMMUNITY): Payer: 59

## 2017-05-18 ENCOUNTER — Inpatient Hospital Stay (EMERGENCY_DEPARTMENT_HOSPITAL)
Admission: AD | Admit: 2017-05-18 | Discharge: 2017-05-18 | Disposition: A | Discharge status: Home / Self Care | Payer: 59 | Source: Ambulatory Visit | Attending: Obstetrics and Gynecology | Admitting: Obstetrics and Gynecology

## 2017-05-18 ENCOUNTER — Encounter (HOSPITAL_COMMUNITY): Payer: Self-pay

## 2017-05-18 DIAGNOSIS — O2662 Liver and biliary tract disorders in childbirth: Secondary | ICD-10-CM | POA: Diagnosis not present

## 2017-05-18 DIAGNOSIS — K831 Obstruction of bile duct: Secondary | ICD-10-CM | POA: Diagnosis not present

## 2017-05-18 DIAGNOSIS — O42913 Preterm premature rupture of membranes, unspecified as to length of time between rupture and onset of labor, third trimester: Secondary | ICD-10-CM | POA: Diagnosis not present

## 2017-05-18 DIAGNOSIS — Z79899 Other long term (current) drug therapy: Secondary | ICD-10-CM

## 2017-05-18 DIAGNOSIS — O26613 Liver and biliary tract disorders in pregnancy, third trimester: Secondary | ICD-10-CM | POA: Diagnosis not present

## 2017-05-18 DIAGNOSIS — O4693 Antepartum hemorrhage, unspecified, third trimester: Secondary | ICD-10-CM | POA: Insufficient documentation

## 2017-05-18 DIAGNOSIS — Z3A31 31 weeks gestation of pregnancy: Secondary | ICD-10-CM

## 2017-05-18 LAB — URINALYSIS, ROUTINE W REFLEX MICROSCOPIC
Bilirubin Urine: NEGATIVE
GLUCOSE, UA: NEGATIVE mg/dL
Ketones, ur: NEGATIVE mg/dL
LEUKOCYTES UA: NEGATIVE
Nitrite: NEGATIVE
PROTEIN: NEGATIVE mg/dL
RBC / HPF: NONE SEEN RBC/hpf (ref 0–5)
Specific Gravity, Urine: 1.008 (ref 1.005–1.030)
pH: 6 (ref 5.0–8.0)

## 2017-05-18 LAB — WET PREP, GENITAL
Clue Cells Wet Prep HPF POC: NONE SEEN
Sperm: NONE SEEN
Trich, Wet Prep: NONE SEEN
WBC, Wet Prep HPF POC: NONE SEEN
Yeast Wet Prep HPF POC: NONE SEEN

## 2017-05-18 LAB — OB RESULTS CONSOLE GC/CHLAMYDIA: GC PROBE AMP, GENITAL: NEGATIVE

## 2017-05-18 NOTE — MAU Note (Signed)
Pt presents to MAU with complaints of passing brown mucous this morning at 0445. Denies any pain

## 2017-05-18 NOTE — MAU Provider Note (Signed)
History    Patient Kathy Blackwell is a 31 y.o. G1P0 at [redacted]w[redacted]d here with complaints of mucousy discharge at 3:30 this afternoon. She has recently been diagnosed with cholestastic of pregnancy and started on actogal. She had a BPP yesterday that was 8/8. She denies other bleeding, leaking of fluid or decreased fetal movements. Feels some little cramps like period cramps.   Patient has follow-up BPP on Thursday, May 20, 2017.  CSN: [id number]  Arrival date and time: 05/18/17 1727   First Provider Initiated Contact with Patient 05/18/17 1857      Chief Complaint  Patient presents with  . Vaginal Discharge   Vaginal Discharge  The patient's primary symptoms include vaginal discharge. This is a new problem. The current episode started today. The problem occurs rarely. The problem has been resolved. Associated symptoms include abdominal pain. Pertinent negatives include no constipation, diarrhea or urgency. The vaginal discharge was mucoid. There has been no bleeding. She has not been passing clots. She has not been passing tissue. Nothing aggravates the symptoms. She has tried nothing for the symptoms.  Patient has had nothing in the vagina since Sunday.     OB History    Gravida Para Term Preterm AB Living   1             SAB TAB Ectopic Multiple Live Births                  Past Medical History:  Diagnosis Date  . Acute hepatitis 07/2015  . Elevated LFTs   . Finger laceration involving tendon 10/11/2014   right ring and small fingers    Past Surgical History:  Procedure Laterality Date  . TENDON REPAIR Right 10/15/2014   Procedure: RIGHT SMALL TENDON REPAIR;  Surgeon: Leanora Cover, MD;  Location: [place];  Service: Orthopedics;  Laterality: Right;  . TONSILLECTOMY AND ADENOIDECTOMY    . WOUND EXPLORATION Right 10/15/2014   Procedure: RIGHT RING WOUND EXPLORATION;  Surgeon: Leanora Cover, MD;  Location: [place];  Service: Orthopedics;   Laterality: Right;    History reviewed. No pertinent family history.  Social History  Substance Use Topics  . Smoking status: Never Smoker  . Smokeless tobacco: Never Used  . Alcohol use Yes     Comment: occasionally    Allergies: No Known Allergies  Prescriptions Prior to Admission  Medication Sig Dispense Refill Last Dose  . Prenatal Vit-Fe Fumarate-FA (PRENATAL MULTIVITAMIN) TABS tablet Take 1 tablet by mouth daily at 12 noon.   05/18/2017 at Unknown time  . ursodiol (ACTIGALL) 300 MG capsule Take 1 capsule (300 mg total) by mouth 3 (three) times daily. 90 capsule 2 05/18/2017 at Unknown time    Review of Systems  Respiratory: Negative.   Cardiovascular: Negative.   Gastrointestinal: Positive for abdominal pain. Negative for constipation and diarrhea.  Genitourinary: Positive for vaginal discharge. Negative for urgency.  Psychiatric/Behavioral: Negative.    Physical Exam   Blood pressure 124/82, pulse 97, temperature 98 F (36.7 C), resp. rate 18, weight 144 lb (65.3 kg).  Physical Exam  Constitutional: She is oriented to person, place, and time. She appears well-developed and well-nourished.  HENT:  Head: Normocephalic.  Neck: Normal range of motion.  Respiratory: Effort normal.  GI: Soft. She exhibits no distension and no mass. There is no tenderness. There is no rebound and no guarding.  Genitourinary: Vaginal discharge found.  Genitourinary Comments: NEFG; no lesions on vaginal walls. No blood in the  vagina but brown mucous extruding from the cervical os. Cervix is closed. No CMT, suprapubic or adnexal tenderness.   Musculoskeletal: Normal range of motion.  Neurological: She is oriented to person, place, and time. She has normal reflexes.  Skin: Skin is warm and dry.  Psychiatric: She has a normal mood and affect.    MAU Course  Procedures  MDM -[place]: 8/8 BPP with official MFM report pending -wet prep: no evidence of infection -UA: negative -NST : 135; mod  variabiliy, present acels, no decels, occasional mild uterine ctx that patient does not feel Assessment and Plan   1. Vaginal bleeding in pregnancy, third trimester    2. Reviewed patient ultrasound, NST  and physical findings with Dr. Matthew Saras, who agrees that patient is stable for discharge.  3. Patient to keep follow up with provider on 05-20-2017; reviewed warning signs and when to return to the MAU.   Mervyn Skeeters Marillyn Goren CNM 05/18/2017, 7:11 PM

## 2017-05-18 NOTE — Discharge Instructions (Signed)
Cholestasis of Pregnancy Cholestasis refers to any condition that causes the flow of digestive fluid (bile) produced by the liver to slow down or stop. Cholestasis of pregnancy is most common toward the end of pregnancy (thirdtrimester), but it can occur any time during pregnancy. The condition often goes away soon after giving birth. Cholestasis may be uncomfortable, but it is usually harmless to you. However, it can be harmful to your baby. Cholestasis may increase the risk of:  Your baby being born too early (preterm delivery).  Your baby having a slow heart rate and lack of oxygen during delivery (fetal distress).  Losing your baby before delivery (stillbirth).  What are the causes? The exact cause of this condition is not known, but it may be related to:  Pregnancy hormones. The gallbladder normally holds bile until you need it to help digest fat in your diet. Pregnancy hormones may cause the flow of bile to slow down and back up into your liver. Bile may then get into your bloodstream and cause cholestasis symptoms.  Changes in your genes (genetic mutations). Specifically, genes that affect how the liver releases bile.  What increases the risk? You are more likely to develop this condition if:  You had cholestasis during a previous pregnancy.  You have a family history of cholestasis.  You have liver problems.  You are having multiple babies, such as twins or triplets.  What are the signs or symptoms? The most common symptom of this condition is intense itching (pruritus), especially on the palms of your hands and soles of your feet. The itching can spread to the rest of your body and is often worse at night. You will not usually have a rash. Other symptoms may include:  Feeling tired.  Pain in your upper right abdomen.  Dark-colored urine.  Light-colored stools.  Poor appetite.  Yellowish discoloration of your skin and the whites of your eyes (jaundice).  How is this  diagnosed? This condition is diagnosed based on:  Your medical history.  A physical exam.  Blood tests.  If you have an inherited risk for developing this condition, you may also have genetic testing. How is this treated? The goal of treatment is to make you comfortable and keep your baby safe. Your health care provider may:  Prescribe medicine to reduce bile acid in your bloodstream, relieve symptoms, and help keep your baby safe.  Give you vitamin K before delivery to prevent excessive bleeding.  Check your baby frequently (fetal monitoring).  Perform regular blood tests to check your bile levels and liver function until your baby is delivered.  Recommend starting (inducing) your labor and delivery by week 36 or 37 of pregnancy, or as soon as your baby's lungs have developed enough.  Follow these instructions at home:  Take over-the-counter and prescription medicines only as told by your health care provider.  Take cool baths to soothe itchy skin.  Wear comfortable, loose-fitting, cotton clothing to reduce itching.  Keep your fingernails short to prevent skin irritation from scratching.  Keep all follow-up visits and prenatal visits as told by your health care provider. This is important. Contact a health care provider if:  Your symptoms get worse, even with treatment.  You develop pain in your right side.  You have unusual swelling in your abdomen, feet, ankles, or legs.  You have a fever.  You are more thirsty than usual. Get help right away if:  You go into early labor.  You have a headache that   does not go away or causes changes in vision.  You have nausea or you vomit.  You have severe pain in your abdomen or shoulders.  You have shortness of breath. Summary  Cholestasis of pregnancy is most common toward the end of pregnancy (thirdtrimester), but it can occur any time during your pregnancy.  The condition often goes away soon after your baby is  born.  The most common symptom of cholestasis of pregnancy is intense itching (pruritus), especially on the palms of your hands and soles of your feet.  This condition may be treated with medicine, frequent monitoring, or starting (inducing) labor and delivery by week 36 or 37 of pregnancy. This information is not intended to replace advice given to you by your health care provider. Make sure you discuss any questions you have with your health care provider. Document Released: 11/27/2000 Document Revised: 11/14/2016 Document Reviewed: 11/14/2016 Elsevier Interactive Patient Education  2017 Elsevier Inc.  

## 2017-05-19 NOTE — Addendum Note (Signed)
Encounter addended by: Eusebio Friendly, RT, RVT, RDMS on: 05/19/2017  2:23 PM<BR>    Actions taken: Diagnosis association updated

## 2017-05-20 ENCOUNTER — Encounter (HOSPITAL_COMMUNITY): Payer: Self-pay | Admitting: *Deleted

## 2017-05-20 ENCOUNTER — Inpatient Hospital Stay (HOSPITAL_COMMUNITY)
Admission: AD | Admit: 2017-05-20 | Discharge: 2017-05-24 | DRG: 775 | Disposition: A | Discharge status: Home / Self Care | Payer: 59 | Source: Ambulatory Visit | Attending: Obstetrics and Gynecology | Admitting: Obstetrics and Gynecology

## 2017-05-20 DIAGNOSIS — K831 Obstruction of bile duct: Secondary | ICD-10-CM | POA: Diagnosis present

## 2017-05-20 DIAGNOSIS — Z3480 Encounter for supervision of other normal pregnancy, unspecified trimester: Secondary | ICD-10-CM | POA: Diagnosis not present

## 2017-05-20 DIAGNOSIS — Z3A31 31 weeks gestation of pregnancy: Secondary | ICD-10-CM

## 2017-05-20 DIAGNOSIS — O42919 Preterm premature rupture of membranes, unspecified as to length of time between rupture and onset of labor, unspecified trimester: Secondary | ICD-10-CM | POA: Diagnosis present

## 2017-05-20 DIAGNOSIS — O42913 Preterm premature rupture of membranes, unspecified as to length of time between rupture and onset of labor, third trimester: Secondary | ICD-10-CM | POA: Diagnosis present

## 2017-05-20 DIAGNOSIS — O288 Other abnormal findings on antenatal screening of mother: Secondary | ICD-10-CM | POA: Diagnosis not present

## 2017-05-20 DIAGNOSIS — O42013 Preterm premature rupture of membranes, onset of labor within 24 hours of rupture, third trimester: Secondary | ICD-10-CM | POA: Diagnosis not present

## 2017-05-20 DIAGNOSIS — O2662 Liver and biliary tract disorders in childbirth: Secondary | ICD-10-CM | POA: Diagnosis present

## 2017-05-20 DIAGNOSIS — Z3400 Encounter for supervision of normal first pregnancy, unspecified trimester: Secondary | ICD-10-CM | POA: Diagnosis not present

## 2017-05-20 DIAGNOSIS — Z3A32 32 weeks gestation of pregnancy: Secondary | ICD-10-CM | POA: Diagnosis not present

## 2017-05-20 LAB — TYPE AND SCREEN
ABO/RH(D): O POS
Antibody Screen: NEGATIVE

## 2017-05-20 LAB — CBC
HCT: 36.8 % (ref 36.0–46.0)
Hemoglobin: 12.7 g/dL (ref 12.0–15.0)
MCH: 31.1 pg (ref 26.0–34.0)
MCHC: 34.5 g/dL (ref 30.0–36.0)
MCV: 90.2 fL (ref 78.0–100.0)
PLATELETS: 230 10*3/uL (ref 150–400)
RBC: 4.08 MIL/uL (ref 3.87–5.11)
RDW: 13.5 % (ref 11.5–15.5)
WBC: 10.8 10*3/uL — ABNORMAL HIGH (ref 4.0–10.5)

## 2017-05-20 LAB — GC/CHLAMYDIA PROBE AMP (~~LOC~~) NOT AT ARMC
CHLAMYDIA, DNA PROBE: NEGATIVE
Neisseria Gonorrhea: NEGATIVE

## 2017-05-20 MED ORDER — CALCIUM CARBONATE ANTACID 500 MG PO CHEW: 2.0000 | CHEWABLE_TABLET | ORAL | Status: DC | PRN

## 2017-05-20 MED ORDER — PRENATAL MULTIVITAMIN CH
1.0000 | ORAL_TABLET | Freq: Every day | ORAL | Status: DC
  Administered 2017-05-21: 1 via ORAL
  Filled 2017-05-20: qty 1

## 2017-05-20 MED ORDER — SODIUM CHLORIDE 0.9 % IV SOLN
2.0000 g | Freq: Four times a day (QID) | INTRAVENOUS | Status: DC
  Administered 2017-05-20 – 2017-05-22 (×6): 2 g via INTRAVENOUS
  Filled 2017-05-20 (×8): qty 2000

## 2017-05-20 MED ORDER — ZOLPIDEM TARTRATE 5 MG PO TABS: 5.0000 mg | ORAL_TABLET | Freq: Every evening | ORAL | Status: DC | PRN

## 2017-05-20 MED ORDER — MAGNESIUM SULFATE 40 G IN LACTATED RINGERS - SIMPLE
2.0000 g/h | INTRAVENOUS | Status: DC
  Administered 2017-05-20: 2 g/h via INTRAVENOUS
  Filled 2017-05-20: qty 500
  Filled 2017-05-20: qty 40

## 2017-05-20 MED ORDER — DEXTROSE 5 % IV SOLN
500.0000 mg | INTRAVENOUS | Status: AC
  Administered 2017-05-20 – 2017-05-21 (×2): 500 mg via INTRAVENOUS
  Filled 2017-05-20 (×2): qty 500

## 2017-05-20 MED ORDER — ERYTHROMYCIN BASE 250 MG PO TABS: 250.0000 mg | ORAL_TABLET | Freq: Four times a day (QID) | ORAL | Status: DC

## 2017-05-20 MED ORDER — AMOXICILLIN 500 MG PO CAPS: 500.0000 mg | ORAL_CAPSULE | Freq: Three times a day (TID) | ORAL | Status: DC

## 2017-05-20 MED ORDER — AZITHROMYCIN 250 MG PO TABS: 500.0000 mg | ORAL_TABLET | Freq: Every day | ORAL | Status: DC

## 2017-05-20 MED ORDER — ACETAMINOPHEN 325 MG PO TABS: 650.0000 mg | ORAL_TABLET | ORAL | Status: DC | PRN

## 2017-05-20 MED ORDER — MAGNESIUM SULFATE BOLUS VIA INFUSION
6.0000 g | Freq: Once | INTRAVENOUS | Status: AC
  Administered 2017-05-20: 6 g via INTRAVENOUS
  Filled 2017-05-20: qty 500

## 2017-05-20 MED ORDER — DOCUSATE SODIUM 100 MG PO CAPS
100.0000 mg | ORAL_CAPSULE | Freq: Every day | ORAL | Status: DC
  Filled 2017-05-20: qty 1

## 2017-05-20 MED ORDER — URSODIOL 300 MG PO CAPS
300.0000 mg | ORAL_CAPSULE | Freq: Three times a day (TID) | ORAL | Status: DC
  Administered 2017-05-20 – 2017-05-21 (×5): 300 mg via ORAL
  Filled 2017-05-20 (×6): qty 1

## 2017-05-20 MED ORDER — SODIUM CHLORIDE 0.9 % IV SOLN: 250.0000 mg | Freq: Four times a day (QID) | INTRAVENOUS | Status: DC

## 2017-05-20 MED ORDER — BETAMETHASONE SOD PHOS & ACET 6 (3-3) MG/ML IJ SUSP
12.0000 mg | INTRAMUSCULAR | Status: AC
  Administered 2017-05-20 – 2017-05-21 (×2): 12 mg via INTRAMUSCULAR
  Filled 2017-05-20 (×2): qty 2

## 2017-05-20 MED ORDER — LACTATED RINGERS IV SOLN
INTRAVENOUS | Status: DC
  Administered 2017-05-20 – 2017-05-21 (×2): via INTRAVENOUS

## 2017-05-20 NOTE — Progress Notes (Signed)
Contractions spaced out after Mag initiated. Cont Mag and monitor closely. D/C Mag after 12 hours no cervical change or tomorrow AM. Re-SVE prn pressure/pain/bleeding/fetal status.     Arty Baumgartner MD

## 2017-05-20 NOTE — Progress Notes (Signed)
SVE from closed to 1cm. Will start Magnesium now and transfer to L&D. NPO. Re-SVE prn.   Arty Baumgartner MD

## 2017-05-20 NOTE — H&P (Signed)
Kathy Blackwell is a 31 y.o. female presenting for PPROM. She was ruled in x3 in the office. Her pregnancy has been c/b cholestasis. She has occasional ctxn but not regular or strong. +LOF. +FM.  OB History    Gravida Para Term Preterm AB Living   1             SAB TAB Ectopic Multiple Live Births                 Past Medical History:  Diagnosis Date  . Acute hepatitis 07/2015  . Elevated LFTs   . Finger laceration involving tendon 10/11/2014   right ring and small fingers   Past Surgical History:  Procedure Laterality Date  . TENDON REPAIR Right 10/15/2014   Procedure: RIGHT SMALL TENDON REPAIR;  Surgeon: Leanora Cover, MD;  Location: [place];  Service: Orthopedics;  Laterality: Right;  . TONSILLECTOMY AND ADENOIDECTOMY    . WOUND EXPLORATION Right 10/15/2014   Procedure: RIGHT RING WOUND EXPLORATION;  Surgeon: Leanora Cover, MD;  Location: [place];  Service: Orthopedics;  Laterality: Right;   Family History: family history is not on file. Social History:  reports that she has never smoked. She has never used smokeless tobacco. She reports that she drinks alcohol. She reports that she does not use drugs.     Maternal Diabetes: No Genetic Screening: Normal Maternal Ultrasounds/Referrals: Normal Fetal Ultrasounds or other Referrals:  None Maternal Substance Abuse:  No Significant Maternal Medications:  None Significant Maternal Lab Results:  Lab values include: Other: Elevated Bile Acids Other Comments:  None  ROS History   Blood pressure 123/75, pulse 99, temperature 97.9 F (36.6 C), temperature source Oral, resp. rate 18, height 5\' 3"  (1.6 m), weight 64.4 kg (142 lb), SpO2 100 %. Exam Physical Exam  OBGyn Exam  NAD, A&O NWOB Abd soft, nondistended, gravid SVE closed/long/high SSE with grossly ruptured membranes + LOF from cervix w/valsalva. +ferning, + nitrazine  Cat 1 FHT, toco irritable  Prenatal labs: ABO, Rh: --/--/O POS, O  POS (05/25 1736) Antibody: NEG (05/25 1736) Rubella:   RPR:    HBsAg: Negative (05/26 1043)  HIV:    GBS:     Assessment/Plan: 30yo G1P0 @ 31.6wga presenting with PPROM.   # PPROM - counseled regarding risks of expectant management including abruption, infection, and IUFD. Discussed indications for delivery including infection, labor, abruption or non-reassuring fetal status - Delivery at 51 wga or sooner prn as above - BMZ for FLM - defer Mag for CP proph given closed cervix and rare ctxn. Will start if delivery is expected within 12 hours and still <32 wga - No s/s of abruption, infection, or labor. Cervix closed.   # Cholestasis: - previously counseled re: risks expectant manag't  - cont fetal monitoring as below - cont ursodiol 300mg  TID  # FWB:  - [place] today in office with fetus vertex, EFW 54%, female fetus - BPP 8/8 in office today - Plan for cefm overnight, consider switching to daily NSTs in AM  # GBS:  - unknown. Swabbed in the office today. Will f/u results.   # Dispo: - inpatient    Tyson Dense 05/20/2017, 5:18 PM

## 2017-05-21 ENCOUNTER — Telehealth: Payer: Self-pay | Admitting: Gastroenterology

## 2017-05-21 NOTE — Progress Notes (Signed)
Patient is doing well. Denies contractions  FHR Category 1  Toco no contractions Uterus is non tender  Results for orders placed or performed during the hospital encounter of 05/20/17 (from the past 24 hour(s))  Type and screen [place]     Status: None   Collection Time: 05/20/17  5:39 PM  Result Value Ref Range   ABO/RH(D) O POS    Antibody Screen NEG    Sample Expiration 05/23/2017   CBC     Status: Abnormal   Collection Time: 05/20/17  5:39 PM  Result Value Ref Range   WBC 10.8 (H) 4.0 - 10.5 K/uL   RBC 4.08 3.87 - 5.11 MIL/uL   Hemoglobin 12.7 12.0 - 15.0 g/dL   HCT 36.8 36.0 - 46.0 %   MCV 90.2 78.0 - 100.0 fL   MCH 31.1 26.0 - 34.0 pg   MCHC 34.5 30.0 - 36.0 g/dL   RDW 13.5 11.5 - 15.5 %   Platelets 230 150 - 400 K/uL   IMPRESSION: IUP at 56 w  PPROM Cholestasis of pregnancy  PLAN: Patient is currently stable Continue latency antibiotics Discontinue magnesium Advance diet today Continue urodiol

## 2017-05-21 NOTE — Telephone Encounter (Signed)
I called the patient. She is admitted at [facility] hospital, her water broke, they plan to monitor her for 2 weeks there and then deliver the baby at 34 weeks. She won't be able to make her appointment with Hepatologist. She says the itching resolved on Ursodiol. LFTs had downtrended. I asked her to speak with her OB about repeating the LFTs now to make sure of this, and if there are any questions about inpatient service can consult on her case again. I do think she should follow up with Hepatology after her delivery given her history of liver injury and this occurrence. She will call me if she has any questions and I will keep an eye out for her LFTs

## 2017-05-21 NOTE — Telephone Encounter (Signed)
FYI

## 2017-05-21 NOTE — Progress Notes (Signed)
No complaints. Contractions spaced out to q 10-20 min per patient - 1/10. No vaginal bleeding. Continues to leak fluid. AFVSS. Cat 1 FHT. D/c Mag at 12 hour mark (0730) and consider transfer to AP if remains stable.    Arty Baumgartner MD

## 2017-05-21 NOTE — Consult Note (Signed)
Asked by Dr.Grewal to provide prenatal consultation for patient at risk for preterm delivery due to PROM.Marland Kitchen  Mother is 31 y.o. G1 who is now 32.[redacted] weeks EGA, with pregnancy also complicated by gestational cholestasis and she was admitted yesterday.  She is being treated with betamethasone, mag sulfate, and ampicillijn/azithromycin.  Discussed usual expectations for preterm infant at 32+ weeks gestation, including possible needs for DR resuscitation, respiratory support, IV access.  Projected possible length of stay in NICU until 37 wks or more, depending most likely on PO feeding.  She plans to breast feed and will pump postnatally. Also discussed possible use of donor milk.  Patient was attentive, had appropriate questions, and was appreciative of my input. 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  Thank you for consulting Neonatology.  Total time 25 minutes. JWimmer, MD

## 2017-05-22 ENCOUNTER — Encounter (HOSPITAL_COMMUNITY): Payer: Self-pay | Admitting: *Deleted

## 2017-05-22 MED ORDER — DIBUCAINE 1 % RE OINT: 1.0000 "application " | TOPICAL_OINTMENT | RECTAL | Status: DC | PRN

## 2017-05-22 MED ORDER — DIPHENHYDRAMINE HCL 25 MG PO CAPS: 25.0000 mg | ORAL_CAPSULE | Freq: Four times a day (QID) | ORAL | Status: DC | PRN

## 2017-05-22 MED ORDER — OXYTOCIN BOLUS FROM INFUSION
500.0000 mL | Freq: Once | INTRAVENOUS | Status: DC
  Administered 2017-05-22: 500 mL via INTRAVENOUS

## 2017-05-22 MED ORDER — PRENATAL MULTIVITAMIN CH
1.0000 | ORAL_TABLET | Freq: Every day | ORAL | Status: DC
  Administered 2017-05-22 – 2017-05-23 (×2): 1 via ORAL
  Filled 2017-05-22 (×2): qty 1

## 2017-05-22 MED ORDER — LIDOCAINE HCL (PF) 1 % IJ SOLN
INTRAMUSCULAR | Status: AC
  Administered 2017-05-22: 30 mL
  Filled 2017-05-22: qty 30

## 2017-05-22 MED ORDER — ONDANSETRON HCL 4 MG PO TABS: 4.0000 mg | ORAL_TABLET | ORAL | Status: DC | PRN

## 2017-05-22 MED ORDER — SOD CITRATE-CITRIC ACID 500-334 MG/5ML PO SOLN: 30.0000 mL | ORAL | Status: DC | PRN

## 2017-05-22 MED ORDER — OXYTOCIN BOLUS FROM INFUSION
500.0000 mL | Freq: Once | INTRAVENOUS | Status: AC
  Administered 2017-05-22: 500 mL via INTRAVENOUS

## 2017-05-22 MED ORDER — IBUPROFEN 600 MG PO TABS
600.0000 mg | ORAL_TABLET | Freq: Four times a day (QID) | ORAL | Status: DC
  Administered 2017-05-22 – 2017-05-24 (×8): 600 mg via ORAL
  Filled 2017-05-22 (×8): qty 1

## 2017-05-22 MED ORDER — LIDOCAINE HCL (PF) 1 % IJ SOLN
30.0000 mL | INTRAMUSCULAR | Status: DC | PRN
  Filled 2017-05-22: qty 30

## 2017-05-22 MED ORDER — ONDANSETRON HCL 4 MG/2ML IJ SOLN: 4.0000 mg | Freq: Four times a day (QID) | INTRAMUSCULAR | Status: DC | PRN

## 2017-05-22 MED ORDER — BISACODYL 10 MG RE SUPP: 10.0000 mg | Freq: Every day | RECTAL | Status: DC | PRN

## 2017-05-22 MED ORDER — OXYTOCIN 40 UNITS IN LACTATED RINGERS INFUSION - SIMPLE MED
INTRAVENOUS | Status: AC
  Filled 2017-05-22: qty 1000

## 2017-05-22 MED ORDER — COCONUT OIL OIL
1.0000 "application " | TOPICAL_OIL | Status: DC | PRN
  Administered 2017-05-22: 1 via TOPICAL
  Filled 2017-05-22: qty 120

## 2017-05-22 MED ORDER — OXYCODONE-ACETAMINOPHEN 5-325 MG PO TABS: 1.0000 | ORAL_TABLET | ORAL | Status: DC | PRN

## 2017-05-22 MED ORDER — ACETAMINOPHEN 325 MG PO TABS: 650.0000 mg | ORAL_TABLET | ORAL | Status: DC | PRN

## 2017-05-22 MED ORDER — FLEET ENEMA 7-19 GM/118ML RE ENEM: 1.0000 | ENEMA | Freq: Every day | RECTAL | Status: DC | PRN

## 2017-05-22 MED ORDER — LACTATED RINGERS IV SOLN: 500.0000 mL | INTRAVENOUS | Status: DC | PRN

## 2017-05-22 MED ORDER — OXYTOCIN 40 UNITS IN LACTATED RINGERS INFUSION - SIMPLE MED: 2.5000 [IU]/h | INTRAVENOUS | Status: DC

## 2017-05-22 MED ORDER — SIMETHICONE 80 MG PO CHEW: 80.0000 mg | CHEWABLE_TABLET | ORAL | Status: DC | PRN

## 2017-05-22 MED ORDER — ONDANSETRON HCL 4 MG/2ML IJ SOLN: 4.0000 mg | INTRAMUSCULAR | Status: DC | PRN

## 2017-05-22 MED ORDER — LACTATED RINGERS IV SOLN: INTRAVENOUS | Status: DC

## 2017-05-22 MED ORDER — SENNOSIDES-DOCUSATE SODIUM 8.6-50 MG PO TABS
2.0000 | ORAL_TABLET | ORAL | Status: DC
  Administered 2017-05-23: 2 via ORAL
  Filled 2017-05-22 (×2): qty 2

## 2017-05-22 MED ORDER — MEASLES, MUMPS & RUBELLA VAC ~~LOC~~ INJ: 0.5000 mL | INJECTION | Freq: Once | SUBCUTANEOUS | Status: DC

## 2017-05-22 MED ORDER — WITCH HAZEL-GLYCERIN EX PADS: 1.0000 "application " | MEDICATED_PAD | CUTANEOUS | Status: DC | PRN

## 2017-05-22 MED ORDER — LIDOCAINE HCL (PF) 1 % IJ SOLN: 30.0000 mL | INTRAMUSCULAR | Status: DC | PRN

## 2017-05-22 MED ORDER — MEDROXYPROGESTERONE ACETATE 150 MG/ML IM SUSP: 150.0000 mg | INTRAMUSCULAR | Status: DC | PRN

## 2017-05-22 MED ORDER — OXYCODONE-ACETAMINOPHEN 5-325 MG PO TABS: 2.0000 | ORAL_TABLET | ORAL | Status: DC | PRN

## 2017-05-22 MED ORDER — OXYTOCIN 40 UNITS IN LACTATED RINGERS INFUSION - SIMPLE MED
2.5000 [IU]/h | INTRAVENOUS | Status: DC
  Administered 2017-05-22: 2.5 [IU]/h via INTRAVENOUS

## 2017-05-22 MED ORDER — BENZOCAINE-MENTHOL 20-0.5 % EX AERO
1.0000 "application " | INHALATION_SPRAY | CUTANEOUS | Status: DC | PRN
  Administered 2017-05-22: 1 via TOPICAL
  Filled 2017-05-22: qty 56

## 2017-05-22 MED ORDER — TETANUS-DIPHTH-ACELL PERTUSSIS 5-2.5-18.5 LF-MCG/0.5 IM SUSP: 0.5000 mL | Freq: Once | INTRAMUSCULAR | Status: DC

## 2017-05-22 MED ORDER — ZOLPIDEM TARTRATE 5 MG PO TABS: 5.0000 mg | ORAL_TABLET | Freq: Every evening | ORAL | Status: DC | PRN

## 2017-05-22 NOTE — Lactation Note (Signed)
This note was copied from a baby's chart. Lactation Consultation Note  Patient Name: Kathy Blackwell [id number] Date: 05/22/2017 Reason for consult: Initial assessment;NICU baby;Infant < 6lbs   Initial consult at 6 hrs old; GA 32.1; BW 3 lbs, 13 oz.  IVF; Mom has female partner who she introduces as her wife.  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 Mom's RN took mom to NICU to latch baby at NICU request since baby was rooting.  RN reports infant latched and took several sucks and swallowed at least twice at breast.   RN set up DEBP upon arrival to room within 6 hrs of birth. Mom has pumped at least twice getting 5-15 ml colostrum easily flowing.   LC reviewed pumping on "initiate" setting turning dial up to 3-4 circles and using hands-on pumping with hand expression at end of pumping session. Decreased mom's flange size to #21 d/t firm P1 tissue not fillling #24 flanges; but as mom's milk production increases explained to mom she may need to increase to #24 flange. Mom independently demonstrated hand expression and was hand expressing several more drops after pumping session ended. Lactation reviewed all information in NICU booklet photocopy. Encouraged pumping every 2 hrs during the day and at least once during the night for a total of 8+ pumpings per day. Mom has [employer] and plans to obtain DEBP from hospital store on Monday prior to discharge.   Lactation brochure also given and informed of NICU LC support, hospital support group and OP services. Encouraged to call for questions or concerns as needed.     Maternal Data Has patient been taught Hand Expression?: Yes Does the patient have breastfeeding experience prior to this delivery?: No  Feeding    LATCH Score/Interventions                      Lactation Tools Discussed/Used WIC Program: No ([employer] Employee) Pump Review: Setup, frequency, and cleaning;Milk Storage Initiated by:: Staff RN Date initiated::  05/22/17   Consult Status Consult Status: Follow-up Date: 05/23/17 Follow-up type: In-patient    Merlene Laughter 05/22/2017, 3:39 PM

## 2017-05-22 NOTE — Progress Notes (Signed)
Pt brought to room per bed from third floor

## 2017-05-22 NOTE — Progress Notes (Signed)
Patient is doing well.  Reports good fetal movement.   Leaking some fluid. Small amount of spotting last night.  BP (!) 109/59 (BP Location: Right Arm)   Pulse 99   Temp 98.8 F (37.1 C)   Resp 16   Ht 5\' 3"  (1.6 m)   Wt 64.4 kg (142 lb)   SpO2 98%   BMI 25.15 kg/m  No results found for this or any previous visit (from the past 24 hour(s)). Abdomen is soft and uterus is non tender  No current facility-administered medications on file prior to encounter.    Current Outpatient Prescriptions on File Prior to Encounter  Medication Sig Dispense Refill  . Prenatal Vit-Fe Fumarate-FA (PRENATAL MULTIVITAMIN) TABS tablet Take 1 tablet by mouth daily at 12 noon.    . ursodiol (ACTIGALL) 300 MG capsule Take 1 capsule (300 mg total) by mouth 3 (three) times daily. 90 capsule 2   IMPRESSION: IUP at 76 w 1 day Cholestasis of pregnancy  PPROM  PLAN: Patient currently stable and without signs of chorioamnionitis Continue latency antibiotics Also check cbc, cmet in am

## 2017-05-23 LAB — COMPREHENSIVE METABOLIC PANEL
ALK PHOS: 144 U/L — AB (ref 38–126)
ALT: 691 U/L — AB (ref 14–54)
ANION GAP: 10 (ref 5–15)
AST: 209 U/L — ABNORMAL HIGH (ref 15–41)
Albumin: 2.3 g/dL — ABNORMAL LOW (ref 3.5–5.0)
BUN: 17 mg/dL (ref 6–20)
CALCIUM: 9.4 mg/dL (ref 8.9–10.3)
CHLORIDE: 109 mmol/L (ref 101–111)
CO2: 20 mmol/L — ABNORMAL LOW (ref 22–32)
CREATININE: 0.83 mg/dL (ref 0.44–1.00)
Glucose, Bld: 109 mg/dL — ABNORMAL HIGH (ref 65–99)
Potassium: 4 mmol/L (ref 3.5–5.1)
SODIUM: 139 mmol/L (ref 135–145)
Total Bilirubin: 1.1 mg/dL (ref 0.3–1.2)
Total Protein: 5.2 g/dL — ABNORMAL LOW (ref 6.5–8.1)

## 2017-05-23 LAB — CBC
HCT: 35 % — ABNORMAL LOW (ref 36.0–46.0)
Hemoglobin: 11.9 g/dL — ABNORMAL LOW (ref 12.0–15.0)
MCH: 31.2 pg (ref 26.0–34.0)
MCHC: 34 g/dL (ref 30.0–36.0)
MCV: 91.9 fL (ref 78.0–100.0)
Platelets: 231 10*3/uL (ref 150–400)
RBC: 3.81 MIL/uL — AB (ref 3.87–5.11)
RDW: 13.8 % (ref 11.5–15.5)
WBC: 10.4 10*3/uL (ref 4.0–10.5)

## 2017-05-23 NOTE — Progress Notes (Signed)
CSW acknowledges NICU admission.   Patient screened out for psychosocial assessment since none of the following apply:  Psychosocial stressors documented in mother or baby's chart  Gestation less than 32 weeks  Code at delivery   Infant with anomalies  Please contact the Clinical Social Worker if specific needs arise, or by MOB's request.  Princesa Willig, MSW, LCSW-A Clinical Social Worker  [facility]  Office: [phone]  

## 2017-05-23 NOTE — Progress Notes (Signed)
Patient doing well.  BP 108/63 (BP Location: Right Arm)   Pulse 83   Temp 98.2 F (36.8 C) (Oral)   Resp 16   Ht 5\' 3"  (1.6 m)   Wt 64.4 kg (142 lb)   SpO2 100%   Breastfeeding? Unknown   BMI 25.15 kg/m  Results for orders placed or performed during the hospital encounter of 05/20/17 (from the past 24 hour(s))  CBC     Status: Abnormal   Collection Time: 05/23/17  5:00 AM  Result Value Ref Range   WBC 10.4 4.0 - 10.5 K/uL   RBC 3.81 (L) 3.87 - 5.11 MIL/uL   Hemoglobin 11.9 (L) 12.0 - 15.0 g/dL   HCT 35.0 (L) 36.0 - 46.0 %   MCV 91.9 78.0 - 100.0 fL   MCH 31.2 26.0 - 34.0 pg   MCHC 34.0 30.0 - 36.0 g/dL   RDW 13.8 11.5 - 15.5 %   Platelets 231 150 - 400 K/uL  Comprehensive metabolic panel     Status: Abnormal   Collection Time: 05/23/17  5:00 AM  Result Value Ref Range   Sodium 139 135 - 145 mmol/L   Potassium 4.0 3.5 - 5.1 mmol/L   Chloride 109 101 - 111 mmol/L   CO2 20 (L) 22 - 32 mmol/L   Glucose, Bld 109 (H) 65 - 99 mg/dL   BUN 17 6 - 20 mg/dL   Creatinine, Ser 0.83 0.44 - 1.00 mg/dL   Calcium 9.4 8.9 - 10.3 mg/dL   Total Protein 5.2 (L) 6.5 - 8.1 g/dL   Albumin 2.3 (L) 3.5 - 5.0 g/dL   AST 209 (H) 15 - 41 U/L   ALT 691 (H) 14 - 54 U/L   Alkaline Phosphatase 144 (H) 38 - 126 U/L   Total Bilirubin 1.1 0.3 - 1.2 mg/dL   GFR calc non Af Amer >60 >60 mL/min   GFR calc Af Amer >60 >60 mL/min   Anion gap 10 5 - 15   Abdomen is soft and non tender  PPD #1  NSVD Cholestasis of pregnancy  Routine care Baby doing well in NICU Repeat CMET in am

## 2017-05-23 NOTE — Progress Notes (Signed)
Entered pts room for routine assessment and morning introductions found patient uncomfortable rolling from side to side in bed. Pt described lower abdominal pains that occurred occasionally yet were getting more difficult to handle and some pressure. Fetal monitoring was initiated and toco placed. Regular contractions noted from 1.5 to 4 minutes apart notified Dr. Helane Rima at [phone] with a plan to initiate Mag and transfer pt to L&D. [phone] pt called out for a large gush of green tinged fluid bulging perineum noted vag exam crowning imminent. [place] Dr. Helane Rima, NICU, L&D notified of impending delivery [phone] Pt transferred to L&D via bed.

## 2017-05-23 NOTE — Lactation Note (Signed)
This note was copied from a baby's chart. Lactation Consultation Note  Patient Name: Kathy Blackwell [id number] Date: 05/23/2017 Reason for consult: Follow-up assessment;NICU baby;Infant < 6lbs   Follow up with mom of 12 hour old NICU infant. Mom reports she is pumping every 2-3 hours. She reports she is getting small amounts of colostrum, Reviewed pumping 8-12 x in 24 hours for 15 minutes with DEBP and milk coming to volume. Mom reports she is hand expressing post pumping.   Mom and her partner asked about getting DEBP at d/c. They will stop in store once d/c. Mom without questions/concerns at this time.    Maternal Data Formula Feeding for Exclusion: No Has patient been taught Hand Expression?: Yes Does the patient have breastfeeding experience prior to this delivery?: No  Feeding Feeding Type: Formula Length of feed: 10 min  LATCH Score/Interventions                      Lactation Tools Discussed/Used Pump Review: Setup, frequency, and cleaning Initiated by:: Reviewed   Consult Status Consult Status: Follow-up Date: 05/24/17 Follow-up type: In-patient    Debby Freiberg [name] 05/23/2017, 10:03 AM

## 2017-05-24 LAB — COMPREHENSIVE METABOLIC PANEL
ALK PHOS: 154 U/L — AB (ref 38–126)
ALT: 666 U/L — AB (ref 14–54)
AST: 186 U/L — AB (ref 15–41)
Albumin: 2.4 g/dL — ABNORMAL LOW (ref 3.5–5.0)
Anion gap: 7 (ref 5–15)
BILIRUBIN TOTAL: 1.5 mg/dL — AB (ref 0.3–1.2)
BUN: 16 mg/dL (ref 6–20)
CO2: 22 mmol/L (ref 22–32)
Calcium: 9.5 mg/dL (ref 8.9–10.3)
Chloride: 106 mmol/L (ref 101–111)
Creatinine, Ser: 0.8 mg/dL (ref 0.44–1.00)
GFR calc Af Amer: >60 mL/min (ref >60)
Glucose, Bld: 81 mg/dL (ref 65–99)
Potassium: 4 mmol/L (ref 3.5–5.1)
Sodium: 135 mmol/L (ref 135–145)
TOTAL PROTEIN: 5.9 g/dL — AB (ref 6.5–8.1)

## 2017-05-24 MED ORDER — IBUPROFEN 600 MG PO TABS: 600.0000 mg | ORAL_TABLET | Freq: Four times a day (QID) | ORAL | 1 refills | Status: DC

## 2017-05-24 NOTE — Progress Notes (Signed)
Post Partum Day 2 Subjective: no complaints, up ad lib, voiding, tolerating PO, + flatus and Baby is stable in NICU, no O2 needed, does have NG tube Minimal itching Objective: Blood pressure 121/75, pulse 72, temperature 98 F (36.7 C), temperature source Oral, resp. rate 16, height 5\' 3"  (1.6 m), weight 142 lb (64.4 kg), SpO2 98 %, unknown if currently breastfeeding.  Physical Exam:  General: alert and cooperative Lochia: appropriate Uterine Fundus: firm Incision: healing well DVT Evaluation: No evidence of DVT seen on physical exam. Negative Homan's sign. No cords or calf tenderness. No significant calf/ankle edema.   Recent Labs  05/23/17 0500  HGB 11.9*  HCT 35.0*    Assessment/Plan: Discharge home. Has an appointment with hepatologist tomorrow   LOS: 4 days   CURTIS,CAROL G 05/24/2017, 8:37 AM

## 2017-05-24 NOTE — Lactation Note (Signed)
This note was copied from a baby's chart. Lactation Consultation Note  Patient Name: Kathy Blackwell [id number] Date: 05/24/2017 Reason for consult: Follow-up assessment;NICU baby  NICU baby 31 hours old. Mom reports that she will be picking up her personal pump from the store today. Discussed the benefits of hospital-grade pump for the first 2 weeks of lactation, and mom aware of rental program. Mom reports that she took a BF class and feels that pumping is going well. Mom given NICU booklet with review, and mom aware of pumping rooms in the NICU, OP/BFSG and [place] phone line assistance after D/C.   Maternal Data    Feeding Feeding Type: Donor Breast Milk Length of feed: 30 min  LATCH Score/Interventions                      Lactation Tools Discussed/Used     Consult Status Consult Status: PRN    Andres Labrum 05/24/2017, 10:16 AM

## 2017-05-24 NOTE — Discharge Summary (Signed)
Obstetric Discharge Summary Reason for Admission: rupture of membranes Prenatal Procedures: ultrasound Intrapartum Procedures: spontaneous vaginal delivery Postpartum Procedures: none Complications-Operative and Postpartum: 1 degree perineal laceration Hemoglobin  Date Value Ref Range Status  05/23/2017 11.9 (L) 12.0 - 15.0 g/dL Final   HCT  Date Value Ref Range Status  05/23/2017 35.0 (L) 36.0 - 46.0 % Final    Physical Exam:  General: alert and cooperative Lochia: appropriate Uterine Fundus: firm Incision: healing well DVT Evaluation: No evidence of DVT seen on physical exam. Negative Homan's sign. No cords or calf tenderness. No significant calf/ankle edema.  Discharge Diagnoses: s/p vag delievery @[redacted]  weeks gestation  Discharge Information: Date: 05/24/2017 Activity: pelvic rest Diet: routine Medications: PNV and Ibuprofen Condition: stable Instructions: refer to practice specific booklet Discharge to: home   Newborn Data: Live born female  Birth Weight: 3 lb 13 oz (1730 g) APGAR: 8, 9  Baby is in NICU  Emmani Lesueur G 05/24/2017, 8:44 AM

## 2017-05-24 NOTE — Progress Notes (Signed)
Pt out in wheelchair teaching complete  

## 2017-05-25 DIAGNOSIS — O26619 Liver and biliary tract disorders in pregnancy, unspecified trimester: Secondary | ICD-10-CM | POA: Diagnosis not present

## 2017-05-25 DIAGNOSIS — R740 Nonspecific elevation of levels of transaminase and lactic acid dehydrogenase [LDH]: Secondary | ICD-10-CM | POA: Diagnosis not present

## 2017-06-01 DIAGNOSIS — R21 Rash and other nonspecific skin eruption: Secondary | ICD-10-CM | POA: Diagnosis not present

## 2017-06-01 DIAGNOSIS — O26619 Liver and biliary tract disorders in pregnancy, unspecified trimester: Secondary | ICD-10-CM | POA: Diagnosis not present

## 2017-06-10 ENCOUNTER — Ambulatory Visit: Payer: Self-pay

## 2017-06-10 NOTE — Lactation Note (Signed)
This note was copied from a baby's chart. Lactation Consultation Note  Patient Name: Kathy Blackwell [id number] Date: 06/10/2017 Reason for consult: Follow-up assessment;Infant < 6lbs;NICU baby  Follow up visit at [redacted]w[redacted]d corrected age.  Parents here and wanting to try to latch baby.  Baby is awake and alert.  Baby is dressed in 2 layers with some temperature instability and parents want to keep baby dressed.  Mom has full leaking breasts and reports good supply with pumping.  Baby attempted shallow latch with leaking breast baby slipping off.  Lc fit mom with #20 NS.  Baby latched well and was able to gain with good depth with wide gape and flanged lips.  Baby has strong suck with good swallows and burst of rhythmic sucking.  LC instructed mom on breast compression and massage during feeding to soften breasts.  Baby pulled off and burped.  Parents awakened baby for remainder of feeding.  Baby maintained feeding with swallows for about 10 minutes.  NICU RN lindsay reports baby is nipple fed for 10 minutes and then gavage fed, she will finish this feeding with gavage.   LC discussed options to bring pump here to hospital and use NICU pumping room as needed. Mom is able to apply NS with proper application.  LC gave mom #24 NS as well, but #20 is a good fit now.   Mom reports increasing to #27 flange with pumping and denies concerns.   Mom will call again as needed for feedings.  Parents appreciative of assistance.      Maternal Data    Feeding Feeding Type: Breast Fed Nipple Type: Dr. Roosvelt Harps Preemie Length of feed: 10 min  LATCH Score/Interventions Latch: Repeated attempts needed to sustain latch, nipple held in mouth throughout feeding, stimulation needed to elicit sucking reflex. Intervention(s): Skin to skin;Teach feeding cues;Waking techniques Intervention(s): Breast massage;Breast compression  Audible Swallowing: Spontaneous and intermittent  Type of Nipple: Everted at rest and  after stimulation  Comfort (Breast/Nipple): Soft / non-tender     Hold (Positioning): Assistance needed to correctly position infant at breast and maintain latch. Intervention(s): Breastfeeding basics reviewed;Support Pillows;Position options;Skin to skin  LATCH Score: 8  Lactation Tools Discussed/Used     Consult Status Consult Status: PRN    Justice Britain 06/10/2017, 8:30 PM

## 2017-06-11 ENCOUNTER — Ambulatory Visit: Payer: Self-pay

## 2017-06-11 NOTE — Lactation Note (Signed)
This note was copied from a baby's chart. Lactation Consultation Note  Patient Name: Kathy Blackwell [id number] Date: 06/11/2017 Reason for consult: Follow-up assessment   With this mom of a NICU baby, now 46 weeks old, and 35 weeks CGA. I was asked to help mom latch her baby, but when I was able to see her, she had applied nipple shield, latched the baby, who was actively sucking for at least 15 minutes, before getting tired. I praised mom, and she knows to call for questions/conerns.    Maternal Data    Feeding Feeding Type: Breast Milk Length of feed: 45 min  LATCH Score/Interventions                      Lactation Tools Discussed/Used     Consult Status Consult Status: PRN Follow-up type: In-patient    Tonna Corner 06/11/2017, 4:10 PM

## 2017-06-15 DIAGNOSIS — O26619 Liver and biliary tract disorders in pregnancy, unspecified trimester: Secondary | ICD-10-CM | POA: Diagnosis not present

## 2017-06-24 DIAGNOSIS — O2662 Liver and biliary tract disorders in childbirth: Secondary | ICD-10-CM | POA: Diagnosis not present

## 2017-07-05 DIAGNOSIS — Z1389 Encounter for screening for other disorder: Secondary | ICD-10-CM | POA: Diagnosis not present

## 2017-07-06 LAB — HM PAP SMEAR: HM Pap smear: NORMAL

## 2017-07-26 DIAGNOSIS — O2662 Liver and biliary tract disorders in childbirth: Secondary | ICD-10-CM | POA: Diagnosis not present

## 2017-09-06 ENCOUNTER — Other Ambulatory Visit: Payer: Self-pay

## 2017-09-06 ENCOUNTER — Telehealth: Payer: Self-pay

## 2017-09-06 DIAGNOSIS — R945 Abnormal results of liver function studies: Principal | ICD-10-CM

## 2017-09-06 DIAGNOSIS — R7989 Other specified abnormal findings of blood chemistry: Secondary | ICD-10-CM

## 2017-09-06 NOTE — Telephone Encounter (Signed)
Okay; thanks.

## 2017-09-06 NOTE — Telephone Encounter (Signed)
FYI, I will order this test.

## 2017-09-23 ENCOUNTER — Other Ambulatory Visit (INDEPENDENT_AMBULATORY_CARE_PROVIDER_SITE_OTHER): Payer: 59

## 2017-09-23 DIAGNOSIS — R945 Abnormal results of liver function studies: Secondary | ICD-10-CM

## 2017-09-23 DIAGNOSIS — R7989 Other specified abnormal findings of blood chemistry: Secondary | ICD-10-CM

## 2017-09-23 LAB — HEPATIC FUNCTION PANEL
ALT: 90 U/L — ABNORMAL HIGH (ref 0–35)
AST: 32 U/L (ref 0–37)
Albumin: 4.3 g/dL (ref 3.5–5.2)
Alkaline Phosphatase: 169 U/L — ABNORMAL HIGH (ref 39–117)
BILIRUBIN DIRECT: 0.1 mg/dL (ref 0.0–0.3)
BILIRUBIN TOTAL: 0.4 mg/dL (ref 0.2–1.2)
TOTAL PROTEIN: 6.7 g/dL (ref 6.0–8.3)

## 2017-09-24 ENCOUNTER — Other Ambulatory Visit: Payer: Self-pay | Admitting: Gastroenterology

## 2017-09-24 DIAGNOSIS — R740 Nonspecific elevation of levels of transaminase and lactic acid dehydrogenase [LDH]: Principal | ICD-10-CM

## 2017-09-24 DIAGNOSIS — R7401 Elevation of levels of liver transaminase levels: Secondary | ICD-10-CM

## 2017-10-22 ENCOUNTER — Other Ambulatory Visit (INDEPENDENT_AMBULATORY_CARE_PROVIDER_SITE_OTHER): Payer: 59

## 2017-10-22 DIAGNOSIS — R7401 Elevation of levels of liver transaminase levels: Secondary | ICD-10-CM

## 2017-10-22 DIAGNOSIS — R740 Nonspecific elevation of levels of transaminase and lactic acid dehydrogenase [LDH]: Secondary | ICD-10-CM | POA: Diagnosis not present

## 2017-10-22 LAB — HEPATIC FUNCTION PANEL
ALK PHOS: 167 U/L — AB (ref 39–117)
ALT: 127 U/L — ABNORMAL HIGH (ref 0–35)
AST: 52 U/L — ABNORMAL HIGH (ref 0–37)
Albumin: 4.2 g/dL (ref 3.5–5.2)
BILIRUBIN DIRECT: 0.1 mg/dL (ref 0.0–0.3)
TOTAL PROTEIN: 6.7 g/dL (ref 6.0–8.3)
Total Bilirubin: 0.5 mg/dL (ref 0.2–1.2)

## 2017-10-26 ENCOUNTER — Other Ambulatory Visit: Payer: Self-pay

## 2017-10-26 DIAGNOSIS — R748 Abnormal levels of other serum enzymes: Secondary | ICD-10-CM

## 2017-10-28 ENCOUNTER — Other Ambulatory Visit: Payer: Self-pay | Admitting: Gastroenterology

## 2017-10-28 ENCOUNTER — Telehealth: Payer: Self-pay

## 2017-10-28 DIAGNOSIS — R748 Abnormal levels of other serum enzymes: Secondary | ICD-10-CM

## 2017-10-28 NOTE — Telephone Encounter (Signed)
Received message that patient had not read latest note re: MRCP date/time. Called and lvm for her about scheduled test, also if not convenient gave her the phone number to reschedule.

## 2017-10-30 ENCOUNTER — Ambulatory Visit (HOSPITAL_COMMUNITY)
Admission: RE | Admit: 2017-10-30 | Discharge: 2017-10-30 | Disposition: A | Discharge status: Home / Self Care | Payer: 59 | Source: Ambulatory Visit | Attending: Gastroenterology | Admitting: Gastroenterology

## 2017-10-30 DIAGNOSIS — R748 Abnormal levels of other serum enzymes: Secondary | ICD-10-CM | POA: Diagnosis not present

## 2017-10-30 DIAGNOSIS — K769 Liver disease, unspecified: Secondary | ICD-10-CM | POA: Diagnosis not present

## 2017-10-30 DIAGNOSIS — K8020 Calculus of gallbladder without cholecystitis without obstruction: Secondary | ICD-10-CM | POA: Diagnosis not present

## 2017-10-30 DIAGNOSIS — R945 Abnormal results of liver function studies: Secondary | ICD-10-CM | POA: Diagnosis not present

## 2017-10-30 DIAGNOSIS — R932 Abnormal findings on diagnostic imaging of liver and biliary tract: Secondary | ICD-10-CM | POA: Diagnosis not present

## 2017-10-30 MED ORDER — GADOBENATE DIMEGLUMINE 529 MG/ML IV SOLN
15.0000 mL | Freq: Once | INTRAVENOUS | Status: AC | PRN
  Administered 2017-10-30: 12 mL via INTRAVENOUS

## 2017-11-12 DIAGNOSIS — K8020 Calculus of gallbladder without cholecystitis without obstruction: Secondary | ICD-10-CM | POA: Diagnosis not present

## 2017-11-16 ENCOUNTER — Other Ambulatory Visit: Payer: Self-pay

## 2017-11-16 ENCOUNTER — Telehealth: Payer: Self-pay | Admitting: Gastroenterology

## 2017-11-16 DIAGNOSIS — R740 Nonspecific elevation of levels of transaminase and lactic acid dehydrogenase [LDH]: Secondary | ICD-10-CM | POA: Diagnosis not present

## 2017-11-16 DIAGNOSIS — O2662 Liver and biliary tract disorders in childbirth: Secondary | ICD-10-CM | POA: Diagnosis not present

## 2017-11-16 DIAGNOSIS — R748 Abnormal levels of other serum enzymes: Secondary | ICD-10-CM

## 2017-11-16 NOTE — Telephone Encounter (Signed)
Order placed for US biopsy of liver, radiology will review information and contact patient to schedule. Patient aware to expect a call about scheduling test.

## 2017-11-16 NOTE — Telephone Encounter (Signed)
Routed to Dr. Armbruster. 

## 2017-11-16 NOTE — Telephone Encounter (Signed)
I called Kathy Blackwell. Kathy Blackwell saw Dr. Zollie Scale earlier today. He is repeating labs for chronic liver diseases to ensure nothing has changed, and is overall recommending another liver biopsy given enzymes are persistently elevated and have remainder higher than prior baseline. I think this is reasonable.  Almyra Free can you help set up IR guided liver biopsy for Kathy Blackwell? Thanks

## 2017-11-22 ENCOUNTER — Other Ambulatory Visit: Payer: Self-pay | Admitting: Radiology

## 2017-11-23 ENCOUNTER — Other Ambulatory Visit: Payer: Self-pay | Admitting: General Surgery

## 2017-11-24 ENCOUNTER — Ambulatory Visit (HOSPITAL_COMMUNITY)
Admission: RE | Admit: 2017-11-24 | Discharge: 2017-11-24 | Disposition: A | Discharge status: Home / Self Care | Payer: 59 | Source: Ambulatory Visit | Attending: Gastroenterology | Admitting: Gastroenterology

## 2017-11-24 ENCOUNTER — Ambulatory Visit: Payer: Self-pay | Admitting: Nurse Practitioner

## 2017-11-24 DIAGNOSIS — R748 Abnormal levels of other serum enzymes: Secondary | ICD-10-CM | POA: Insufficient documentation

## 2017-11-24 DIAGNOSIS — R945 Abnormal results of liver function studies: Secondary | ICD-10-CM | POA: Diagnosis not present

## 2017-11-24 DIAGNOSIS — R7989 Other specified abnormal findings of blood chemistry: Secondary | ICD-10-CM | POA: Diagnosis not present

## 2017-11-24 LAB — CBC
HEMATOCRIT: 40.4 % (ref 36.0–46.0)
HEMOGLOBIN: 13.5 g/dL (ref 12.0–15.0)
MCH: 29 pg (ref 26.0–34.0)
MCHC: 33.4 g/dL (ref 30.0–36.0)
MCV: 86.7 fL (ref 78.0–100.0)
Platelets: 217 10*3/uL (ref 150–400)
RBC: 4.66 MIL/uL (ref 3.87–5.11)
RDW: 13.1 % (ref 11.5–15.5)
WBC: 4.8 10*3/uL (ref 4.0–10.5)

## 2017-11-24 LAB — APTT: APTT: 27 s (ref 24–36)

## 2017-11-24 LAB — PREGNANCY, URINE: PREG TEST UR: NEGATIVE

## 2017-11-24 LAB — PROTIME-INR
INR: 0.95
Prothrombin Time: 12.6 seconds (ref 11.4–15.2)

## 2017-11-24 MED ORDER — SODIUM CHLORIDE 0.9 % IV SOLN: INTRAVENOUS | Status: DC

## 2017-11-24 MED ORDER — MIDAZOLAM HCL 2 MG/2ML IJ SOLN
INTRAMUSCULAR | Status: AC | PRN
  Administered 2017-11-24 (×3): 1 mg via INTRAVENOUS

## 2017-11-24 MED ORDER — GELATIN ABSORBABLE 12-7 MM EX MISC
CUTANEOUS | Status: AC
  Filled 2017-11-24: qty 1

## 2017-11-24 MED ORDER — MIDAZOLAM HCL 2 MG/2ML IJ SOLN
INTRAMUSCULAR | Status: AC
  Filled 2017-11-24: qty 6

## 2017-11-24 MED ORDER — FENTANYL CITRATE (PF) 100 MCG/2ML IJ SOLN
INTRAMUSCULAR | Status: DC
Start: 2017-11-24 — End: 2017-11-25
  Filled 2017-11-24: qty 4

## 2017-11-24 MED ORDER — LIDOCAINE HCL (PF) 1 % IJ SOLN
INTRAMUSCULAR | Status: AC
  Filled 2017-11-24: qty 30

## 2017-11-24 MED ORDER — OXYCODONE HCL 5 MG PO TABS
5.0000 mg | ORAL_TABLET | ORAL | Status: DC | PRN
Start: 2017-11-24 — End: 2017-11-25

## 2017-11-24 MED ORDER — FENTANYL CITRATE (PF) 100 MCG/2ML IJ SOLN
INTRAMUSCULAR | Status: AC | PRN
  Administered 2017-11-24 (×2): 25 ug via INTRAVENOUS
  Administered 2017-11-24: 50 ug via INTRAVENOUS

## 2017-11-24 NOTE — Discharge Instructions (Signed)
Moderate Conscious Sedation, Adult, Care After °These instructions provide you with information about caring for yourself after your procedure. Your health care provider may also give you more specific instructions. Your treatment has been planned according to current medical practices, but problems sometimes occur. Call your health care provider if you have any problems or questions after your procedure. °What can I expect after the procedure? °After your procedure, it is common: °· To feel sleepy for several hours. °· To feel clumsy and have poor balance for several hours. °· To have poor judgment for several hours. °· To vomit if you eat too soon. ° °Follow these instructions at home: °For at least 24 hours after the procedure: ° °· Do not: °? Participate in activities where you could fall or become injured. °? Drive. °? Use heavy machinery. °? Drink alcohol. °? Take sleeping pills or medicines that cause drowsiness. °? Make important decisions or sign legal documents. °? Take care of children on your own. °· Rest. °Eating and drinking °· Follow the diet recommended by your health care provider. °· If you vomit: °? Drink water, juice, or soup when you can drink without vomiting. °? Make sure you have little or no nausea before eating solid foods. °General instructions °· Have a responsible adult stay with you until you are awake and alert. °· Take over-the-counter and prescription medicines only as told by your health care provider. °· If you smoke, do not smoke without supervision. °· Keep all follow-up visits as told by your health care provider. This is important. °Contact a health care provider if: °· You keep feeling nauseous or you keep vomiting. °· You feel light-headed. °· You develop a rash. °· You have a fever. °Get help right away if: °· You have trouble breathing. °This information is not intended to replace advice given to you by your health care provider. Make sure you discuss any questions you have  with your health care provider. °Document Released: 09/20/2013 Document Revised: 05/04/2016 Document Reviewed: 03/21/2016 °Elsevier Interactive Patient Education © 2018 [employer] ° ° °Needle Biopsy, Care After °These instructions give you information about caring for yourself after your procedure. Your doctor may also give you more specific instructions. Call your doctor if you have any problems or questions after your procedure. °Follow these instructions at home: °· Rest as told by your doctor. °· Take medicines only as told by your doctor. °· There are many different ways to close and cover the biopsy site, including stitches (sutures), skin glue, and adhesive strips. Follow instructions from your doctor about: °? How to take care of your biopsy site. °? When and how you should change your bandage (dressing). °? When you should remove your dressing. °? Removing whatever was used to close your biopsy site. °· Check your biopsy site every day for signs of infection. Watch for: °? Redness, swelling, or pain. °? Fluid, blood, or pus. °Contact a doctor if: °· You have a fever. °· You have redness, swelling, or pain at the biopsy site, and it lasts longer than a few days. °· You have fluid, blood, or pus coming from the biopsy site. °· You feel sick to your stomach (nauseous). °· You throw up (vomit). °Get help right away if: °· You are short of breath. °· You have trouble breathing. °· Your chest hurts. °· You feel dizzy or you pass out (faint). °· You have bleeding that does not stop with pressure or a bandage. °· You cough up blood. °·   Your belly (abdomen) hurts. °This information is not intended to replace advice given to you by your health care provider. Make sure you discuss any questions you have with your health care provider. °Document Released: 11/12/2008 Document Revised: 05/07/2016 Document Reviewed: 11/26/2014 °Elsevier Interactive Patient Education © 2018 [employer] ° °

## 2017-11-24 NOTE — Sedation Documentation (Signed)
Report given to [facility] in [place]

## 2017-11-24 NOTE — Procedures (Signed)
US guided core biopsies of right hepatic lobe.  3 cores obtained.  Minimal blood loss and no immediate complication.  Bedrest 3 hours.

## 2017-11-24 NOTE — H&P (Signed)
Chief Complaint: elevated LFTs  Referring Physician:Dr. 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  She had a liver bx done about a year and a half ago with minimal results.  It had improved, but during her pregnancy her LFTs went back up into the 700s range.  She is 6 months post partum and they have returned to the 100s.  A new random liver biopsy has been requested.  She has no other complaints.  Past Medical History:  Past Medical History:  Diagnosis Date  . Acute hepatitis 07/2015  . Elevated LFTs   . Finger laceration involving tendon 10/11/2014   right ring and small fingers    Past Surgical History:  Past Surgical History:  Procedure Laterality Date  . TENDON REPAIR Right 10/15/2014   Procedure: RIGHT SMALL TENDON REPAIR;  Surgeon: Leanora Cover, MD;  Location: [facility];  Service: Orthopedics;  Laterality: Right;  . TONSILLECTOMY AND ADENOIDECTOMY    . WOUND EXPLORATION Right 10/15/2014   Procedure: RIGHT RING WOUND EXPLORATION;  Surgeon: Leanora Cover, MD;  Location: [facility];  Service: Orthopedics;  Laterality: Right;    Family History: No family history on file.  Social History:  reports that  has never smoked. she has never used smokeless tobacco. She reports that she drinks alcohol. She reports that she does not use drugs.  Allergies: No Known Allergies  Medications: Medications reviewed in epic  Please HPI for pertinent positives, otherwise complete 10 system ROS negative.  Mallampati Score: MD Evaluation Airway: WNL Heart: WNL Abdomen: WNL Chest/ Lungs: WNL ASA  Classification: 2 Mallampati/Airway Score: One  Physical Exam: BP 125/81 (BP Location: Right Arm)   Pulse 85   Temp 97.7 F (36.5 C) (Oral)   Ht 5\' 3"  (1.6 m)   Wt 120 lb (54.4 kg)    LMP 11/01/2017 (Approximate)   SpO2 100%   Breastfeeding? Yes   BMI 21.26 kg/m   Body mass index is 21.26 kg/m.  General: pleasant, WD, WN white female who is laying in bed in NAD HEENT: head is normocephalic, atraumatic.  Sclera are noninjected.  PERRL.  Ears and nose without any masses or lesions.  Mouth is pink and moist Heart: regular, rate, and rhythm.  Normal s1,s2. No obvious murmurs, gallops, or rubs noted.  Palpable radial pulses bilaterally Lungs: CTAB, no wheezes, rhonchi, or rales noted.  Respiratory effort nonlabored Abd: soft, NT, ND, +BS, no masses, hernias, or organomegaly Psych: A&Ox3 with an appropriate affect.  Labs: Results for orders placed or performed during the hospital encounter of 11/24/17 (from the past 48 hour(s))  APTT upon arrival     Status: None   Collection Time: 11/24/17 11:56 AM  Result Value Ref Range   aPTT 27 24 - 36 seconds  CBC upon arrival     Status: None   Collection Time: 11/24/17 11:56 AM  Result Value Ref Range   WBC 4.8 4.0 - 10.5 K/uL   RBC 4.66 3.87 - 5.11 MIL/uL   Hemoglobin 13.5 12.0 - 15.0 g/dL   HCT 40.4 36.0 - 46.0 %   MCV 86.7 78.0 - 100.0 fL   MCH 29.0 26.0 - 34.0 pg   MCHC 33.4 30.0 - 36.0 g/dL   RDW 13.1 11.5 - 15.5 %   Platelets 217  150 - 400 K/uL  Protime-INR upon arrival     Status: None   Collection Time: 11/24/17 11:56 AM  Result Value Ref Range   Prothrombin Time 12.6 11.4 - 15.2 seconds   INR 0.95   Pregnancy, urine     Status: None   Collection Time: 11/24/17 12:23 PM  Result Value Ref Range   Preg Test, Ur NEGATIVE NEGATIVE    Comment:        THE SENSITIVITY OF THIS METHODOLOGY IS >20 mIU/mL.     Imaging: No results found.  Assessment/Plan 1. Elevated LFTs  Proceed with a random liver biopsy today.  Labs and vitals have been reviewed.  She is breastfeeding and I have contacted pharmacy to determine the half life of fentanyl and versed to determine when she can safely resume feeding.  This has been  discussed with her.   Risks and benefits discussed with the patient including, but not limited to bleeding, infection, damage to adjacent structures or low yield requiring additional tests. All of the patient's questions were answered, patient is agreeable to proceed. Consent signed and in chart.  Thank you for this interesting consult.  I greatly enjoyed meeting Kathy Blackwell and look forward to participating in their care.  A copy of this report was sent to the requesting provider on this date.  Electronically Signed: Henreitta Cea 11/24/2017, 1:13 PM   I spent a total of  30 Minutes   in face to face in clinical consultation, greater than 50% of which was counseling/coordinating care for elevated LFTs

## 2017-12-01 ENCOUNTER — Ambulatory Visit (INDEPENDENT_AMBULATORY_CARE_PROVIDER_SITE_OTHER): Payer: 59 | Admitting: Nurse Practitioner

## 2017-12-01 ENCOUNTER — Encounter: Payer: Self-pay | Admitting: Nurse Practitioner

## 2017-12-01 VITALS — BP 130/82 | HR 93 | Temp 97.8°F | Ht 63.0 in | Wt 121.0 lb

## 2017-12-01 DIAGNOSIS — Z0000 Encounter for general adult medical examination without abnormal findings: Secondary | ICD-10-CM | POA: Diagnosis not present

## 2017-12-01 NOTE — Progress Notes (Signed)
Subjective:    Patient ID: Kathy Blackwell, female    DOB: 07-08-1986, 31 y.o.   MRN: [id number]  Patient presents today for complete physical (new patient)  HPI Kathy Blackwell presents to establish care. She is married with a 110month old baby girl. She is still breast feeding. she denies any acute complaint today. Her pregnancy was completed by elevated liver enzymes and bilirubin. This is being followed by Dr. Havery Moros (GI) and Dr. Zollie Scale (Hepatologist). There is not a clear cause of elevated liver enzymes at this time. MRCP and liver biopsy done Hepatitis panel, EBV, autoimmune blood test done.  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  Denies any acute complain.  Gyn : Dr. Lynnette Caffey ([facility] Had labs done by GYN this year.  Reviewed recent labs done 05/2017 by GI (CBC, CMP, lipid panel).  Immunizations: (TDAP, Hep C screen, Pneumovax, Influenza, zoster)  Health Maintenance  Topic Date Due  . Pap Smear  01/23/2008  . Tetanus Vaccine  10/11/2024  . Flu Shot  Completed  . HIV Screening  Completed   Diet: heart healthy.  Weight:  Wt Readings from Last 3 Encounters:  12/01/17 121 lb (54.9 kg)  11/24/17 120 lb (54.4 kg)  05/20/17 142 lb (64.4 kg)   Exercise:walking. Fall Risk: Fall Risk  12/01/2017  Falls in the past year? No   Home Safety:home with wife and daughter.  Depression/Suicide: Depression screen [facility] 2/9 12/01/2017  Decreased Interest 0  Down, Depressed, Hopeless 0  PHQ - 2 Score 0   Pap Smear (every 40yrs for >21-29 without HPV, every 106yrs for >30-57yrs with HPV):up to date.  Vision:up to date.  Dental:up to date.  Advanced Directive: Advanced Directives 11/24/2017  Does Patient Have a Medical Advance Directive? Yes  Type of Paramedic of [place];Living will  Does patient want to make changes to medical advance directive? No - Patient declined  Copy of [place] in Chart? No - copy requested  Would patient like information on creating a medical advance directive? -   Sexual History (birth control, marital status, STD):married and sexually active  Medications and allergies reviewed with patient and updated if appropriate.  Patient Active Problem List   Diagnosis Date Noted  . Preterm labor 05/22/2017  . NSVD (normal spontaneous vaginal delivery) 05/22/2017  . Preterm premature rupture of membranes (PPROM) with unknown onset of labor 05/20/2017  . Cholestasis during pregnancy 05/10/2017  . Cholestasis of pregnancy 05/10/2017  . Elevated liver enzymes 05/07/2017  . Acute hepatitis 07/19/2015  . Cholelithiasis 07/19/2015    No current outpatient medications on file prior to visit.   No current facility-administered medications on file prior to visit.     Past Medical History:  Diagnosis Date  . Acute hepatitis 07/2015  . Elevated LFTs   . Finger laceration involving tendon 10/11/2014   right ring and small fingers    Past Surgical History:  Procedure Laterality Date  . TENDON REPAIR Right 10/15/2014   Procedure: RIGHT SMALL TENDON REPAIR;  Surgeon: Leanora Cover, MD;  Location: [place];  Service: Orthopedics;  Laterality: Right;  . TONSILLECTOMY AND ADENOIDECTOMY  1993  . WOUND EXPLORATION Right 10/15/2014   Procedure: RIGHT RING WOUND EXPLORATION;  Surgeon: Leanora Cover, MD;  Location: [place];  Service: Orthopedics;  Laterality: Right;    Social History   Socioeconomic History  . Marital status: Married    Spouse  name: None  . Number of children: 0  . Years of education: None  . Highest education level: None  Social Needs  . Financial resource strain: None  . Food insecurity - worry: None  . Food insecurity - inability: None  . Transportation needs - medical: None  . Transportation needs - non-medical: None  Occupational History  . Occupation: Physician  Tobacco Use  . Smoking  status: Never Smoker  . Smokeless tobacco: Never Used  Substance and Sexual Activity  . Alcohol use: Yes    Comment: occasionally  . Drug use: No  . Sexual activity: None  Other Topics Concern  . None  Social History Narrative  . None    Family History  Problem Relation Age of Onset  . Hypertension Mother   . Hypertension Maternal Grandmother   . Heart disease Maternal Grandmother 39  . Cancer Paternal Uncle        colon cancer thought to be related to work environment Best boy)        Review of Systems  Constitutional: Negative for fever, malaise/fatigue and weight loss.  HENT: Negative for congestion and sore throat.   Eyes:       Negative for visual changes  Respiratory: Negative for cough and shortness of breath.   Cardiovascular: Negative for chest pain, palpitations and leg swelling.  Gastrointestinal: Negative for blood in stool, constipation, diarrhea and heartburn.  Genitourinary: Negative for dysuria, frequency and urgency.  Musculoskeletal: Negative for falls, joint pain and myalgias.  Skin: Negative for rash.  Neurological: Negative for dizziness, sensory change and headaches.  Endo/Heme/Allergies: Does not bruise/bleed easily.  Psychiatric/Behavioral: Negative for depression, substance abuse and suicidal ideas. The patient is not nervous/anxious.     Objective:   Vitals:   12/01/17 1311  BP: 130/82  Pulse: 93  Temp: 97.8 F (36.6 C)  SpO2: 100%    Body mass index is 21.43 kg/m.   Physical Examination:  Physical Exam  Constitutional: She is oriented to person, place, and time. No distress.  HENT:  Right Ear: External ear normal.  Left Ear: External ear normal.  Nose: Nose normal.  Mouth/Throat: Oropharynx is clear and moist.  Eyes: Conjunctivae and EOM are normal. Pupils are equal, round, and reactive to light.  Neck: Normal range of motion. Neck supple. No thyromegaly present.  Cardiovascular: Normal rate, regular rhythm and normal  heart sounds.  Pulmonary/Chest: Effort normal and breath sounds normal.  Abdominal: Soft. Bowel sounds are normal. She exhibits no distension and no mass. There is no tenderness.  Musculoskeletal: She exhibits no edema.  Lymphadenopathy:    She has no cervical adenopathy.  Neurological: She is alert and oriented to person, place, and time. Gait normal.  Skin: Skin is warm and dry.  Psychiatric: Mood, memory, affect and judgment normal.  Vitals reviewed.   ASSESSMENT and PLAN:  Kathy Blackwell was seen today for establish care.  Diagnoses and all orders for this visit:  Preventative health care   No problem-specific Assessment & Plan notes found for this encounter.    Recent Results (from the past 2160 hour(s))  Hepatic function panel     Status: Abnormal   Collection Time: 09/23/17  7:33 AM  Result Value Ref Range   Total Bilirubin 0.4 0.2 - 1.2 mg/dL   Bilirubin, Direct 0.1 0.0 - 0.3 mg/dL   Alkaline Phosphatase 169 (H) 39 - 117 U/L   AST 32 0 - 37 U/L   ALT 90 (H) 0 - 35 U/L  Total Protein 6.7 6.0 - 8.3 g/dL   Albumin 4.3 3.5 - 5.2 g/dL  Hepatic function panel     Status: Abnormal   Collection Time: 10/22/17  7:32 AM  Result Value Ref Range   Total Bilirubin 0.5 0.2 - 1.2 mg/dL   Bilirubin, Direct 0.1 0.0 - 0.3 mg/dL   Alkaline Phosphatase 167 (H) 39 - 117 U/L   AST 52 (H) 0 - 37 U/L   ALT 127 (H) 0 - 35 U/L   Total Protein 6.7 6.0 - 8.3 g/dL   Albumin 4.2 3.5 - 5.2 g/dL  APTT upon arrival     Status: None   Collection Time: 11/24/17 11:56 AM  Result Value Ref Range   aPTT 27 24 - 36 seconds  CBC upon arrival     Status: None   Collection Time: 11/24/17 11:56 AM  Result Value Ref Range   WBC 4.8 4.0 - 10.5 K/uL   RBC 4.66 3.87 - 5.11 MIL/uL   Hemoglobin 13.5 12.0 - 15.0 g/dL   HCT 40.4 36.0 - 46.0 %   MCV 86.7 78.0 - 100.0 fL   MCH 29.0 26.0 - 34.0 pg   MCHC 33.4 30.0 - 36.0 g/dL   RDW 13.1 11.5 - 15.5 %   Platelets 217 150 - 400 K/uL  Protime-INR upon  arrival     Status: None   Collection Time: 11/24/17 11:56 AM  Result Value Ref Range   Prothrombin Time 12.6 11.4 - 15.2 seconds   INR 0.95   Pregnancy, urine     Status: None   Collection Time: 11/24/17 12:23 PM  Result Value Ref Range   Preg Test, Ur NEGATIVE NEGATIVE    Comment:        THE SENSITIVITY OF THIS METHODOLOGY IS >20 mIU/mL.    Follow up: Return if symptoms worsen or fail to improve.  Wilfred Lacy, NP

## 2017-12-01 NOTE — Patient Instructions (Addendum)
Please sign medical release to get records from Dr. Morris:[facility].  Health Maintenance, Female Adopting a healthy lifestyle and getting preventive care can go a long way to promote health and wellness. Talk with your health care provider about what schedule of regular examinations is right for you. This is a good chance for you to check in with your provider about disease prevention and staying healthy. In between checkups, there are plenty of things you can do on your own. Experts have done a lot of research about which lifestyle changes and preventive measures are most likely to keep you healthy. Ask your health care provider for more information. Weight and diet Eat a healthy diet  Be sure to include plenty of vegetables, fruits, low-fat dairy products, and lean protein.  Do not eat a lot of foods high in solid fats, added sugars, or salt.  Get regular exercise. This is one of the most important things you can do for your health. ? Most adults should exercise for at least 150 minutes each week. The exercise should increase your heart rate and make you sweat (moderate-intensity exercise). ? Most adults should also do strengthening exercises at least twice a week. This is in addition to the moderate-intensity exercise.  Maintain a healthy weight  Body mass index (BMI) is a measurement that can be used to identify possible weight problems. It estimates body fat based on height and weight. Your health care provider can help determine your BMI and help you achieve or maintain a healthy weight.  For females 67 years of age and older: ? A BMI below 18.5 is considered underweight. ? A BMI of 18.5 to 24.9 is normal. ? A BMI of 25 to 29.9 is considered overweight. ? A BMI of 30 and above is considered obese.  Watch levels of cholesterol and blood lipids  You should start having your blood tested for lipids and cholesterol at 30 years of age, then have this test every 5  years.  You may need to have your cholesterol levels checked more often if: ? Your lipid or cholesterol levels are high. ? You are older than 30 years of age. ? You are at high risk for heart disease.  Cancer screening Lung Cancer  Lung cancer screening is recommended for adults 74-4 years old who are at high risk for lung cancer because of a history of smoking.  A yearly low-dose CT scan of the lungs is recommended for people who: ? Currently smoke. ? Have quit within the past 15 years. ? Have at least a 30-pack-year history of smoking. A pack year is smoking an average of one pack of cigarettes a day for 1 year.  Yearly screening should continue until it has been 15 years since you quit.  Yearly screening should stop if you develop a health problem that would prevent you from having lung cancer treatment.  Breast Cancer  Practice breast self-awareness. This means understanding how your breasts normally appear and feel.  It also means doing regular breast self-exams. Let your health care provider know about any changes, no matter how small.  If you are in your 20s or 30s, you should have a clinical breast exam (CBE) by a health care provider every 1-3 years as part of a regular health exam.  If you are 53 or older, have a CBE every year. Also consider having a breast X-ray (mammogram) every year.  If you have a family history of breast cancer, talk to your  health care provider about genetic screening.  If you are at high risk for breast cancer, talk to your health care provider about having an MRI and a mammogram every year.  Breast cancer gene (BRCA) assessment is recommended for women who have family members with BRCA-related cancers. BRCA-related cancers include: ? Breast. ? Ovarian. ? Tubal. ? Peritoneal cancers.  Results of the assessment will determine the need for genetic counseling and BRCA1 and BRCA2 testing.  Cervical Cancer Your health care provider may  recommend that you be screened regularly for cancer of the pelvic organs (ovaries, uterus, and vagina). This screening involves a pelvic examination, including checking for microscopic changes to the surface of your cervix (Pap test). You may be encouraged to have this screening done every 3 years, beginning at age 51.  For women ages 51-65, health care providers may recommend pelvic exams and Pap testing every 3 years, or they may recommend the Pap and pelvic exam, combined with testing for human papilloma virus (HPV), every 5 years. Some types of HPV increase your risk of cervical cancer. Testing for HPV may also be done on women of any age with unclear Pap test results.  Other health care providers may not recommend any screening for nonpregnant women who are considered low risk for pelvic cancer and who do not have symptoms. Ask your health care provider if a screening pelvic exam is right for you.  If you have had past treatment for cervical cancer or a condition that could lead to cancer, you need Pap tests and screening for cancer for at least 20 years after your treatment. If Pap tests have been discontinued, your risk factors (such as having a new sexual partner) need to be reassessed to determine if screening should resume. Some women have medical problems that increase the chance of getting cervical cancer. In these cases, your health care provider may recommend more frequent screening and Pap tests.  Colorectal Cancer  This type of cancer can be detected and often prevented.  Routine colorectal cancer screening usually begins at 30 years of age and continues through 30 years of age.  Your health care provider may recommend screening at an earlier age if you have risk factors for colon cancer.  Your health care provider may also recommend using home test kits to check for hidden blood in the stool.  A small camera at the end of a tube can be used to examine your colon directly  (sigmoidoscopy or colonoscopy). This is done to check for the earliest forms of colorectal cancer.  Routine screening usually begins at age 58.  Direct examination of the colon should be repeated every 5-10 years through 30 years of age. However, you may need to be screened more often if early forms of precancerous polyps or small growths are found.  Skin Cancer  Check your skin from head to toe regularly.  Tell your health care provider about any new moles or changes in moles, especially if there is a change in a mole's shape or color.  Also tell your health care provider if you have a mole that is larger than the size of a pencil eraser.  Always use sunscreen. Apply sunscreen liberally and repeatedly throughout the day.  Protect yourself by wearing long sleeves, pants, a wide-brimmed hat, and sunglasses whenever you are outside.  Heart disease, diabetes, and high blood pressure  High blood pressure causes heart disease and increases the risk of stroke. High blood pressure is more likely to  develop in: ? People who have blood pressure in the high end of the normal range (130-139/85-89 mm Hg). ? People who are overweight or obese. ? People who are African American.  If you are 42-7 years of age, have your blood pressure checked every 3-5 years. If you are 41 years of age or older, have your blood pressure checked every year. You should have your blood pressure measured twice-once when you are at a hospital or clinic, and once when you are not at a hospital or clinic. Record the average of the two measurements. To check your blood pressure when you are not at a hospital or clinic, you can use: ? An automated blood pressure machine at a pharmacy. ? A home blood pressure monitor.  If you are between 11 years and 71 years old, ask your health care provider if you should take aspirin to prevent strokes.  Have regular diabetes screenings. This involves taking a blood sample to check your  fasting blood sugar level. ? If you are at a normal weight and have a low risk for diabetes, have this test once every three years after 30 years of age. ? If you are overweight and have a high risk for diabetes, consider being tested at a younger age or more often. Preventing infection Hepatitis B  If you have a higher risk for hepatitis B, you should be screened for this virus. You are considered at high risk for hepatitis B if: ? You were born in a country where hepatitis B is common. Ask your health care provider which countries are considered high risk. ? Your parents were born in a high-risk country, and you have not been immunized against hepatitis B (hepatitis B vaccine). ? You have HIV or AIDS. ? You use needles to inject street drugs. ? You live with someone who has hepatitis B. ? You have had sex with someone who has hepatitis B. ? You get hemodialysis treatment. ? You take certain medicines for conditions, including cancer, organ transplantation, and autoimmune conditions.  Hepatitis C  Blood testing is recommended for: ? Everyone born from [birth year] through 1964. ? Anyone with known risk factors for hepatitis C.  Sexually transmitted infections (STIs)  You should be screened for sexually transmitted infections (STIs) including gonorrhea and chlamydia if: ? You are sexually active and are younger than 30 years of age. ? You are older than 30 years of age and your health care provider tells you that you are at risk for this type of infection. ? Your sexual activity has changed since you were last screened and you are at an increased risk for chlamydia or gonorrhea. Ask your health care provider if you are at risk.  If you do not have HIV, but are at risk, it may be recommended that you take a prescription medicine daily to prevent HIV infection. This is called pre-exposure prophylaxis (PrEP). You are considered at risk if: ? You are sexually active and do not regularly use condoms  or know the HIV status of your partner(s). ? You take drugs by injection. ? You are sexually active with a partner who has HIV.  Talk with your health care provider about whether you are at high risk of being infected with HIV. If you choose to begin PrEP, you should first be tested for HIV. You should then be tested every 3 months for as long as you are taking PrEP. Pregnancy  If you are premenopausal and you may become pregnant,  ask your health care provider about preconception counseling.  If you may become pregnant, take 400 to 800 micrograms (mcg) of folic acid every day.  If you want to prevent pregnancy, talk to your health care provider about birth control (contraception). Osteoporosis and menopause  Osteoporosis is a disease in which the bones lose minerals and strength with aging. This can result in serious bone fractures. Your risk for osteoporosis can be identified using a bone density scan.  If you are 27 years of age or older, or if you are at risk for osteoporosis and fractures, ask your health care provider if you should be screened.  Ask your health care provider whether you should take a calcium or vitamin D supplement to lower your risk for osteoporosis.  Menopause may have certain physical symptoms and risks.  Hormone replacement therapy may reduce some of these symptoms and risks. Talk to your health care provider about whether hormone replacement therapy is right for you. Follow these instructions at home:  Schedule regular health, dental, and eye exams.  Stay current with your immunizations.  Do not use any tobacco products including cigarettes, chewing tobacco, or electronic cigarettes.  If you are pregnant, do not drink alcohol.  If you are breastfeeding, limit how much and how often you drink alcohol.  Limit alcohol intake to no more than 1 drink per day for nonpregnant women. One drink equals 12 ounces of beer, 5 ounces of wine, or 1 ounces of hard  liquor.  Do not use street drugs.  Do not share needles.  Ask your health care provider for help if you need support or information about quitting drugs.  Tell your health care provider if you often feel depressed.  Tell your health care provider if you have ever been abused or do not feel safe at home. This information is not intended to replace advice given to you by your health care provider. Make sure you discuss any questions you have with your health care provider. Document Released: 06/15/2011 Document Revised: 05/07/2016 Document Reviewed: 09/03/2015 Elsevier Interactive Patient Education  [employer].

## 2017-12-09 DIAGNOSIS — K759 Inflammatory liver disease, unspecified: Secondary | ICD-10-CM | POA: Diagnosis not present

## 2017-12-22 IMAGING — MR MR ABDOMEN WO/W CM MRCP
14 of 24 series · 19 of 48 positions shown · IV contrast (multihance)
Comparison: Ultrasound exam 05/08/2017

CLINICAL DATA: History of cholelithiasis.  Elevated LFTs.

EXAM:
MRI ABDOMEN WITHOUT AND WITH CONTRAST (INCLUDING MRCP)
TECHNIQUE: Multiplanar multisequence MR imaging of the abdomen was performed
both before and after the administration of intravenous contrast.
Heavily T2-weighted images of the biliary and pancreatic ducts were
obtained, and three-dimensional MRCP images were rendered by post
processing.
CONTRAST:  12mL MULTIHANCE GADOBENATE DIMEGLUMINE 529 MG/ML IV SOLN

[Series 4: T2 fat-sat · axial · 5.0mm · 0.70mm/px · 1 of 51 slices shown]
[im 1/51]
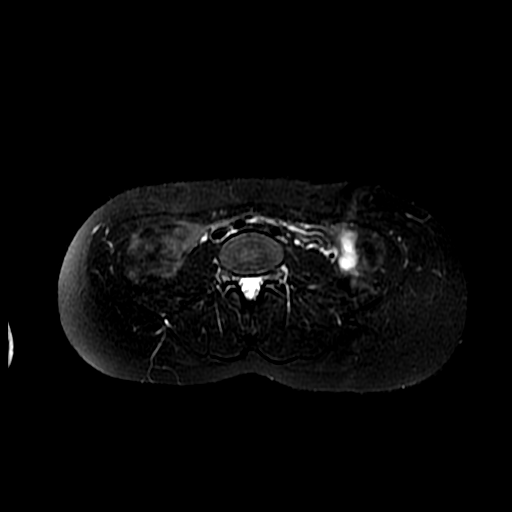

[Series 6: MRCP · coronal · 2.0mm · 0.70mm/px · 1 of 46 slices shown (1 of 2)]
[im 1/46]
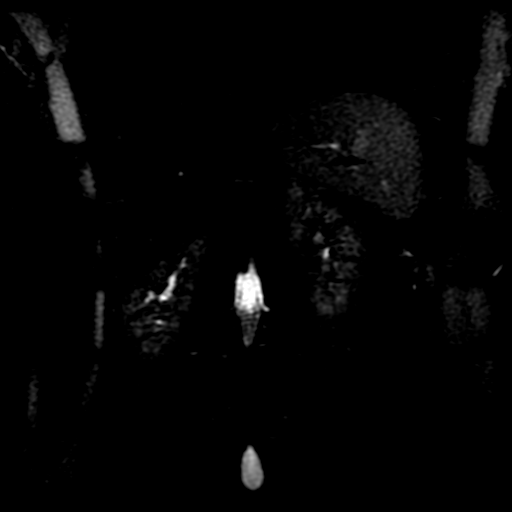

[Series 7: DWI b500 · axial · 6.0mm · 1.41mm/px · 1 of 74 slices shown]
[im 1/74]
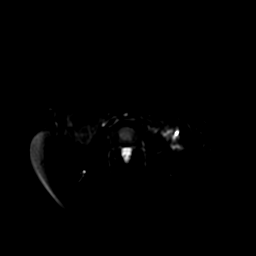

[Series 8: bSSFP · coronal · 5.0mm · 0.70mm/px · 1 of 29 slices shown]
[im 1/29]
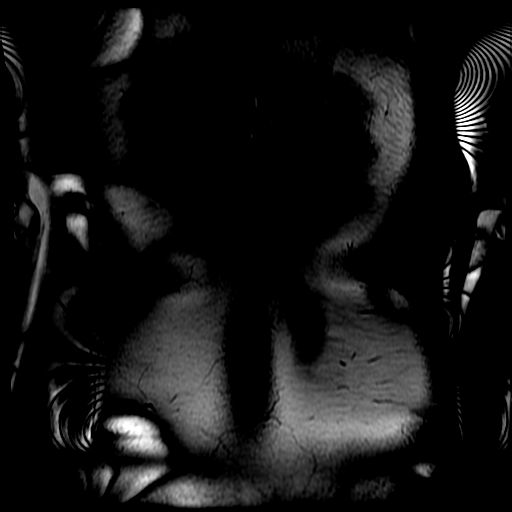

[Series 9: ax dualecho · axial · 5.0mm · 0.70mm/px · 1 of 96 slices shown]
[im 1/96]
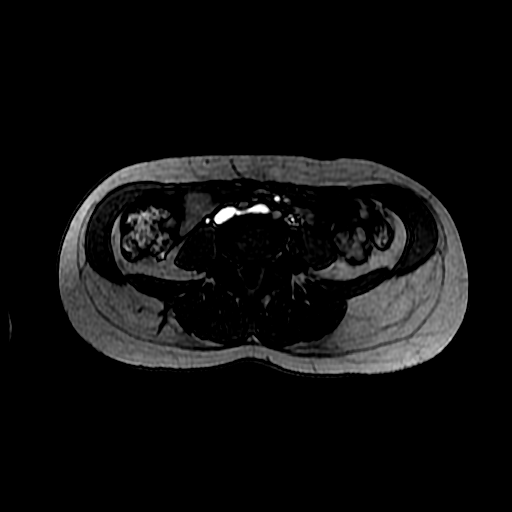

[Series 10: T2 · axial · 5.0mm · 0.70mm/px · 1 of 48 slices shown]
[im 1/48]
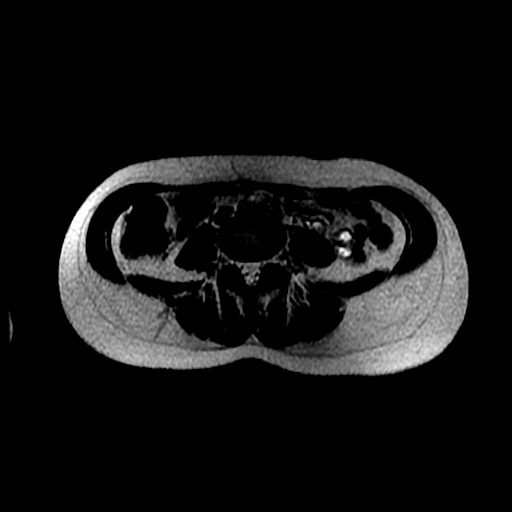

[Series 11: MRCP · oblique · 40.0mm · 0.70mm/px · 1 of 6 slices shown (2 of 2)]
[im 1/6]
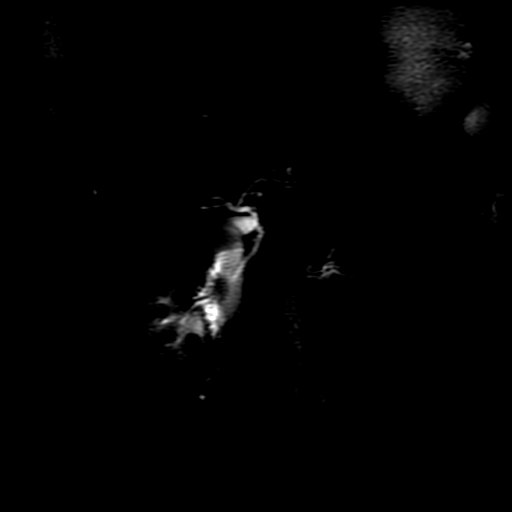

[Series 14: T1 dynamic · coronal · delayed · 4.0mm · 0.70mm/px · 1 of 72 slices shown (1 of 5)]
[im 1/72]
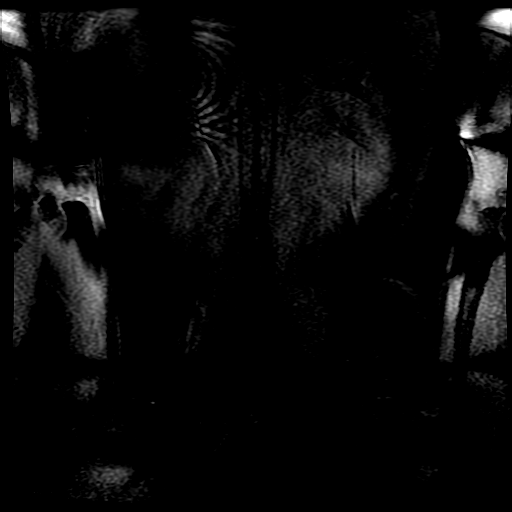

[Series 15: T1 dynamic · coronal · delayed · 4.0mm · 0.76mm/px · 1 of 72 slices shown (2 of 5)]
[im 1/72]
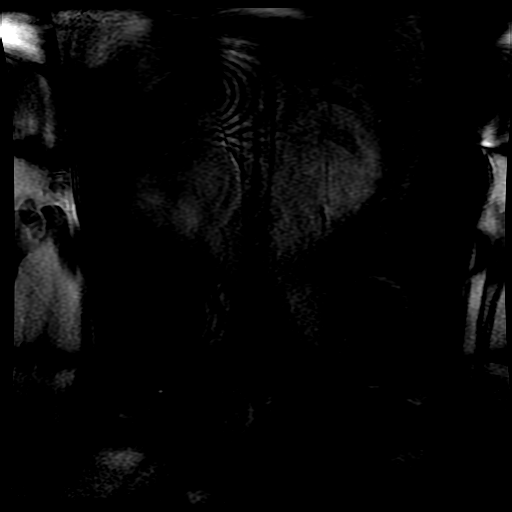

[Series 500: reformatted · oblique · 100.0mm · 0.51mm/px · 1 of 3 slices shown]
[im 1/3]
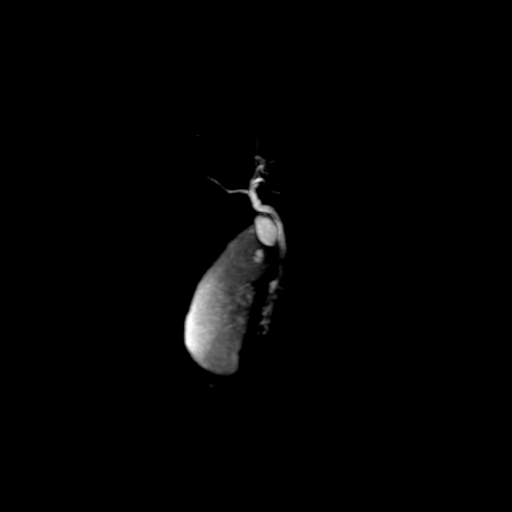

[Series 700: DWI · axial · 6.0mm · 1.41mm/px · 1 of 37 slices shown]
[im 1/37]
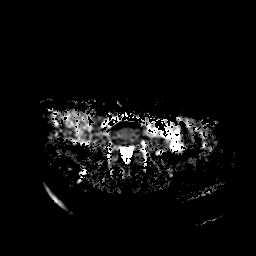

[Series 1200: T1 dynamic · axial · 5.0mm · 0.70mm/px · z∈[-92,+166]mm · 2 of 104 slices shown (3 of 5)]
[im 1/104]
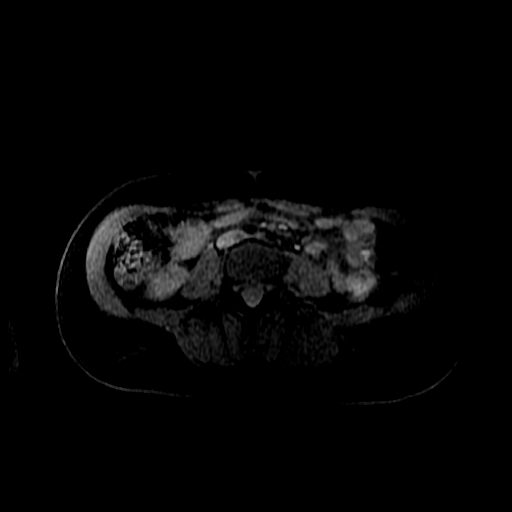
[im 104/104]
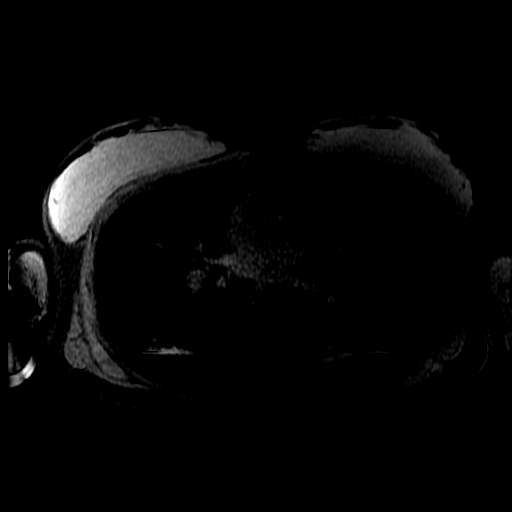

[Series 1300: T1 dynamic · axial · 5.0mm · 0.70mm/px · z∈[-126,+151]mm · 3 of 112 slices shown (4 of 5)]
[im 1/112]
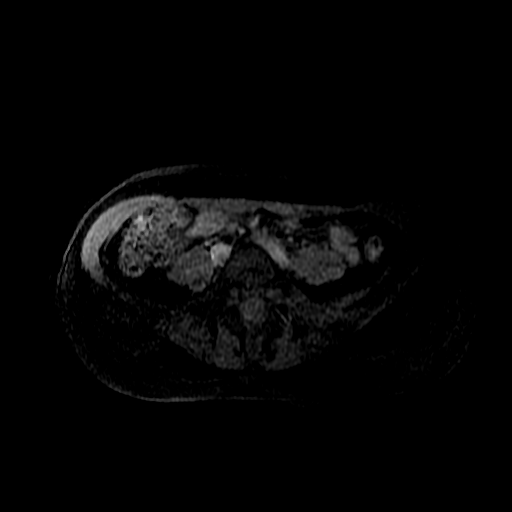
[im 56/112]
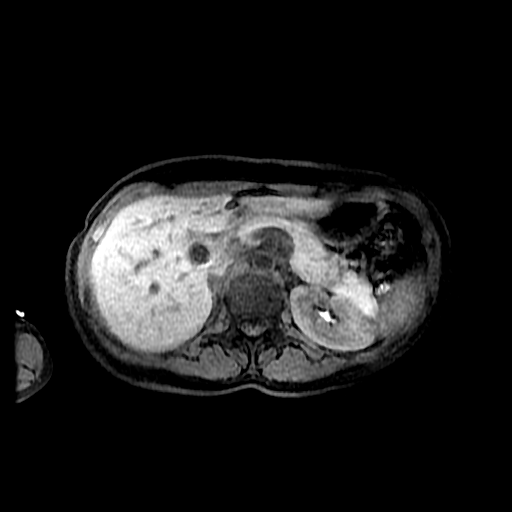
[im 112/112]
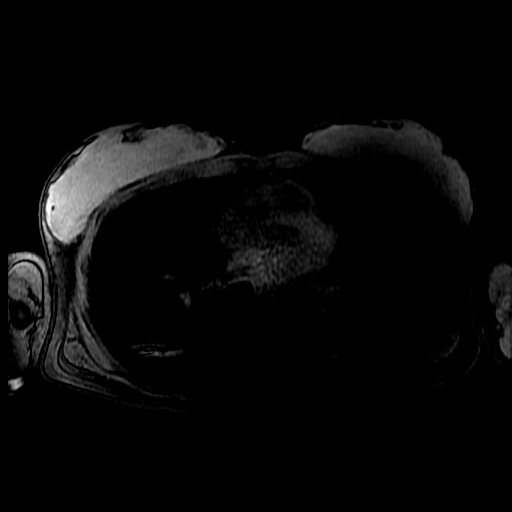

[Series 1301: T1 dynamic · axial · 5.0mm · 0.70mm/px · z∈[-126,+151]mm · 3 of 112 slices shown (5 of 5)]
[im 1/112]
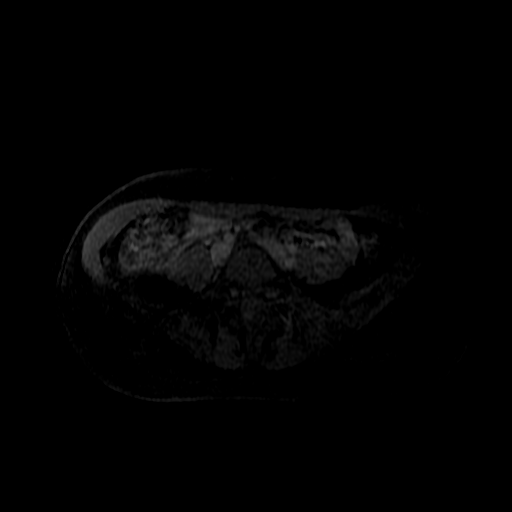
[im 56/112]
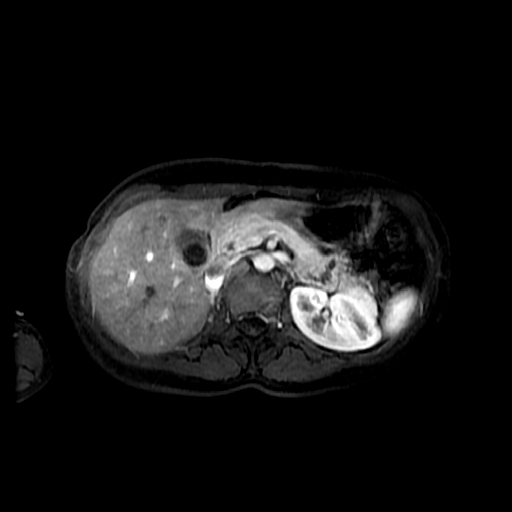
[im 112/112]
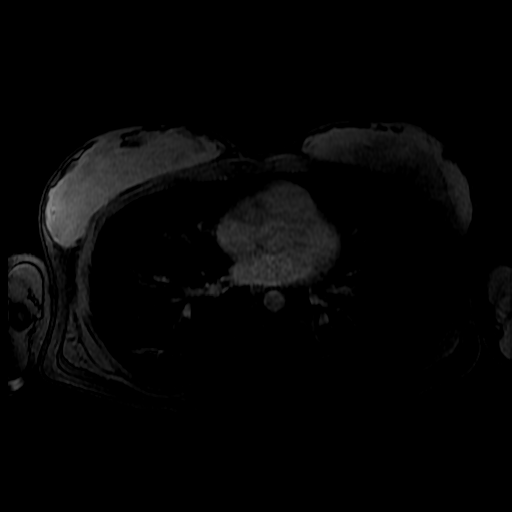

[19 of 48 positions shown; findings below may reference images not displayed]

FINDINGS: Lower chest:  Unremarkable.

Hepatobiliary: Liver measures 15.5 cm craniocaudal length, upper
normal.

4 mm T1 hyperintense, T2 intermediate intensity, well-defined
homogeneous lesion is identified in the lateral segment left liver.
Lesion is well demonstrated on precontrast T1 gradient imaging (see
image 43 series 0599). After IV contrast administration, this lesion
shows no enhancement. Imaging features are most consistent with tiny
cyst complicated by proteinaceous debris or hemorrhage.

Early arterial phase imaging shows a wedge-shaped area of hyper
enhancement in the anterior right liver (segment VIII) seen on image
42 of series 8968. No underlying signal abnormality and these area
on precontrast imaging and enhancement characteristics rapidly
return to background liver parenchyma by phase 2 of the postcontrast
T1 gradient imaging. There is a tiny linear focus of hypoenhancement
in this region on delayed postcontrast imaging (see image 46 series
9862).

Two dominant gallstones are identified measuring 2.1 and 1.6 cm
respectively. No intra or extrahepatic biliary duct dilatation. No
evidence for choledocholithiasis.

Pancreas: No focal mass lesion. No dilatation of the main duct. No
intraparenchymal cyst. No peripancreatic edema.

Spleen: No splenomegaly. No focal mass lesion.

Adrenals/Urinary Tract: No adrenal nodule or mass. Kidneys are
unremarkable.

Stomach/Bowel: Stomach is nondistended. No gastric wall thickening.
No evidence of outlet obstruction. Duodenum is normally positioned
as is the ligament of Treitz. No small bowel or colonic dilatation
within the visualized abdomen.

Vascular/Lymphatic: No abdominal aortic aneurysm. No evidence for
abdominal lymphadenopathy.

Other: No intraperitoneal free fluid.

Musculoskeletal: No abnormal marrow enhancement within the
visualized bony anatomy
IMPRESSION: 1. Cholelithiasis without choledocholithiasis. No intra or
extrahepatic biliary duct dilatation.
2. Wedge-shaped area of differential enhancement in the anterior
right liver without definite underlying mass lesion. Anomalous
perfusion secondary to underlying vascular malformation would be a
consideration. There is a tiny linear hypoenhancing focus near the
apex of this wedge-shaped area of early hyperenhancement,
nonspecific but tiny vascular thrombosis or a tiny focus of ductal
dilatation could have this appearance. Consider repeat MRI in 3-6
months to ensure stability.
3. 4 mm probable complex cyst lateral segment left liver.

## 2018-03-02 DIAGNOSIS — R740 Nonspecific elevation of levels of transaminase and lactic acid dehydrogenase [LDH]: Secondary | ICD-10-CM | POA: Diagnosis not present

## 2018-03-02 DIAGNOSIS — K740 Hepatic fibrosis: Secondary | ICD-10-CM | POA: Diagnosis not present

## 2018-03-02 DIAGNOSIS — K761 Chronic passive congestion of liver: Secondary | ICD-10-CM | POA: Diagnosis not present

## 2018-04-20 DIAGNOSIS — H01003 Unspecified blepharitis right eye, unspecified eyelid: Secondary | ICD-10-CM | POA: Diagnosis not present

## 2018-04-20 DIAGNOSIS — H1013 Acute atopic conjunctivitis, bilateral: Secondary | ICD-10-CM | POA: Diagnosis not present

## 2018-04-20 DIAGNOSIS — H5213 Myopia, bilateral: Secondary | ICD-10-CM | POA: Diagnosis not present

## 2018-04-26 ENCOUNTER — Encounter: Payer: Self-pay | Admitting: Nurse Practitioner

## 2018-04-26 DIAGNOSIS — R748 Abnormal levels of other serum enzymes: Secondary | ICD-10-CM

## 2018-05-04 ENCOUNTER — Other Ambulatory Visit (INDEPENDENT_AMBULATORY_CARE_PROVIDER_SITE_OTHER): Payer: 59

## 2018-05-04 DIAGNOSIS — R748 Abnormal levels of other serum enzymes: Secondary | ICD-10-CM | POA: Diagnosis not present

## 2018-05-04 LAB — HEPATIC FUNCTION PANEL
ALBUMIN: 4.4 g/dL (ref 3.5–5.2)
ALK PHOS: 95 U/L (ref 39–117)
ALT: 87 U/L — ABNORMAL HIGH (ref 0–35)
AST: 33 U/L (ref 0–37)
BILIRUBIN TOTAL: 0.6 mg/dL (ref 0.2–1.2)
Bilirubin, Direct: 0.1 mg/dL (ref 0.0–0.3)
Total Protein: 7 g/dL (ref 6.0–8.3)

## 2018-07-18 ENCOUNTER — Encounter: Payer: Self-pay | Admitting: Nurse Practitioner

## 2018-07-18 ENCOUNTER — Ambulatory Visit (INDEPENDENT_AMBULATORY_CARE_PROVIDER_SITE_OTHER): Payer: 59 | Admitting: Nurse Practitioner

## 2018-07-18 VITALS — BP 112/78 | HR 95 | Temp 98.1°F | Ht 63.0 in | Wt 129.0 lb

## 2018-07-18 DIAGNOSIS — Z136 Encounter for screening for cardiovascular disorders: Secondary | ICD-10-CM | POA: Diagnosis not present

## 2018-07-18 DIAGNOSIS — Z0000 Encounter for general adult medical examination without abnormal findings: Secondary | ICD-10-CM | POA: Diagnosis not present

## 2018-07-18 DIAGNOSIS — R748 Abnormal levels of other serum enzymes: Secondary | ICD-10-CM

## 2018-07-18 DIAGNOSIS — Z13220 Encounter for screening for lipoid disorders: Secondary | ICD-10-CM | POA: Diagnosis not present

## 2018-07-18 LAB — HEPATIC FUNCTION PANEL
ALK PHOS: 79 U/L (ref 39–117)
ALT: 107 U/L — ABNORMAL HIGH (ref 0–35)
AST: 38 U/L — AB (ref 0–37)
Albumin: 4.2 g/dL (ref 3.5–5.2)
BILIRUBIN DIRECT: 0.1 mg/dL (ref 0.0–0.3)
BILIRUBIN TOTAL: 0.5 mg/dL (ref 0.2–1.2)
Total Protein: 6.7 g/dL (ref 6.0–8.3)

## 2018-07-18 LAB — LIPID PANEL
CHOL/HDL RATIO: 2
CHOLESTEROL: 185 mg/dL (ref 0–200)
HDL: 74.2 mg/dL (ref >39.00)
LDL CALC: 101 mg/dL — AB (ref 0–99)
NonHDL: 111.15
TRIGLYCERIDES: 49 mg/dL (ref 0.0–149.0)
VLDL: 9.8 mg/dL (ref 0.0–40.0)

## 2018-07-18 LAB — COMPREHENSIVE METABOLIC PANEL
ALBUMIN: 4.2 g/dL (ref 3.5–5.2)
ALK PHOS: 79 U/L (ref 39–117)
ALT: 107 U/L — ABNORMAL HIGH (ref 0–35)
AST: 38 U/L — ABNORMAL HIGH (ref 0–37)
BUN: 11 mg/dL (ref 6–23)
CALCIUM: 9.4 mg/dL (ref 8.4–10.5)
CHLORIDE: 106 meq/L (ref 96–112)
CO2: 27 mEq/L (ref 19–32)
Creatinine, Ser: 0.76 mg/dL (ref 0.40–1.20)
GFR: 94.05 mL/min (ref >60.00)
Glucose, Bld: 87 mg/dL (ref 70–99)
POTASSIUM: 4.6 meq/L (ref 3.5–5.1)
Sodium: 139 mEq/L (ref 135–145)
TOTAL PROTEIN: 6.7 g/dL (ref 6.0–8.3)
Total Bilirubin: 0.5 mg/dL (ref 0.2–1.2)

## 2018-07-18 LAB — TSH: TSH: 2.32 u[IU]/mL (ref 0.35–4.50)

## 2018-07-18 NOTE — Progress Notes (Signed)
Subjective:    Patient ID: Kathy Blackwell, female    DOB: [birth year], 32 y.o.   MRN: [id number]  Patient presents today for complete physical   HPI   Denies any acute complains.  Exercise: walking 45-49mins, daily 5x/week.  GYN: Dr. Lynnette [facility]y with [facility], up to date with PAP per patient.  Sexual History (orientation,birth control, marital status, STD): sexually active, married, female partner  Depression/Suicide: Depression screen [facility] 2/9 07/18/2018 12/01/2017  Decreased Interest 0 0  Down, Depressed, Hopeless 0 0  PHQ - 2 Score 0 0   No flowsheet data found.  Vision: up to date  Dental:up to date  Immunizations: (TDAP, Hep C screen, Pneumovax, Influenza, zoster)  Health Maintenance  Topic Date Due  . Pap Smear  01/23/2008  . Flu Shot  07/14/2018  . Tetanus Vaccine  10/11/2024  . HIV Screening  Completed   Weight:  Wt Readings from Last 3 Encounters:  07/18/18 129 lb (58.5 kg)  12/01/17 121 lb (54.9 kg)  11/24/17 120 lb (54.4 kg)    Fall Risk: Fall Risk  12/01/2017  Falls in the past year? No   Advanced Directive: Advanced Directives 11/24/2017  Does Patient Have a Medical Advance Directive? Yes  Type of Paramedic of [place];Living will  Does patient want to make changes to medical advance directive? No - Patient declined  Copy of [place] in Chart? No - copy requested  Would patient like information on creating a medical advance directive? -     Medications and allergies reviewed with patient and updated if appropriate.  Patient Active Problem List   Diagnosis Date Noted  . Preterm labor 05/22/2017  . NSVD (normal spontaneous vaginal delivery) 05/22/2017  . Preterm premature rupture of membranes (PPROM) with unknown onset of labor 05/20/2017  . Cholestasis during pregnancy 05/10/2017  . Cholestasis of pregnancy 05/10/2017  . Elevated liver enzymes 05/07/2017  . Acute hepatitis 07/19/2015  .  Cholelithiasis 07/19/2015    No current outpatient medications on file prior to visit.   No current facility-administered medications on file prior to visit.     Past Medical History:  Diagnosis Date  . Acute hepatitis 07/2015  . Elevated LFTs   . Finger laceration involving tendon 10/11/2014   right ring and small fingers    Past Surgical History:  Procedure Laterality Date  . TENDON REPAIR Right 10/15/2014   Procedure: RIGHT SMALL TENDON REPAIR;  Surgeon: Leanora Cover, MD;  Location: [place];  Service: Orthopedics;  Laterality: Right;  . TONSILLECTOMY AND ADENOIDECTOMY  1993  . WOUND EXPLORATION Right 10/15/2014   Procedure: RIGHT RING WOUND EXPLORATION;  Surgeon: Leanora Cover, MD;  Location: [place];  Service: Orthopedics;  Laterality: Right;    Social History   Socioeconomic History  . Marital status: Married    Spouse name: Not on file  . Number of children: 0  . Years of education: Not on file  . Highest education level: Not on file  Occupational History  . Occupation: Physician  Social Needs  . Financial resource strain: Not on file  . Food insecurity:    Worry: Not on file    Inability: Not on file  . Transportation needs:    Medical: Not on file    Non-medical: Not on file  Tobacco Use  . Smoking status: Never Smoker  . Smokeless tobacco: Never Used  Substance and Sexual Activity  . Alcohol use: Yes  Comment: occasionally  . Drug use: No  . Sexual activity: Yes  Lifestyle  . Physical activity:    Days per week: Not on file    Minutes per session: Not on file  . Stress: Not on file  Relationships  . Social connections:    Talks on phone: Not on file    Gets together: Not on file    Attends religious service: Not on file    Active member of club or organization: Not on file    Attends meetings of clubs or organizations: Not on file    Relationship status: Not on file  Other Topics Concern  . Not on file  Social  History Narrative  . Not on file    Family History  Problem Relation Age of Onset  . Hypertension Mother   . Hypertension Maternal Grandmother   . Heart disease Maternal Grandmother 27  . Cancer Paternal Uncle        colon cancer thought to be related to work environment Best boy)  . Cirrhosis Brother        Review of Systems  Constitutional: Negative.   Respiratory: Negative.   Cardiovascular: Negative.   Gastrointestinal: Negative.   Genitourinary: Negative.   Musculoskeletal: Negative.   Skin: Negative.   Neurological: Negative.   Psychiatric/Behavioral: Negative.     Objective:   Vitals:   07/18/18 0807  BP: 112/78  Pulse: 95  Temp: 98.1 F (36.7 C)  SpO2: 100%    Body mass index is 22.85 kg/m.   Physical Examination:  Physical Exam  Constitutional: She is oriented to person, place, and time. She appears well-developed and well-nourished.  HENT:  Right Ear: External ear normal.  Left Ear: External ear normal.  Nose: Nose normal.  Mouth/Throat: Oropharynx is clear and moist.  Neck: Normal range of motion. Neck supple. No thyromegaly present.  Cardiovascular: Normal rate, regular rhythm, normal heart sounds and intact distal pulses.  Pulmonary/Chest: Effort normal and breath sounds normal.  Abdominal: Soft. Bowel sounds are normal. There is no tenderness.  Genitourinary:  Genitourinary Comments: Breast and pelvic exam deferred to GYN per patient  Musculoskeletal: Normal range of motion. She exhibits no deformity.  Lymphadenopathy:    She has no cervical adenopathy.  Neurological: She is alert and oriented to person, place, and time.  Skin: Skin is warm and dry.  Psychiatric: She has a normal mood and affect. Her behavior is normal. Thought content normal.  Vitals reviewed.   ASSESSMENT and PLAN:  Dymphna was seen today for annual exam.  Diagnoses and all orders for this visit:  Preventative health care -     Lipid panel -     TSH -      Comprehensive metabolic panel  Elevated liver enzymes -     Hepatic function panel; Standing -     Hepatic function panel  Encounter for lipid screening for cardiovascular disease -     Lipid panel   No problem-specific Assessment & Plan notes found for this encounter.     Follow up: Return in about 1 year (around 07/19/2019).  Wilfred Lacy, NP

## 2018-07-18 NOTE — Patient Instructions (Addendum)
Please sign medical release to get recent PAP from Dr. Lynnette Caffey (GYN)  Stable lab results including liver enzymes.  Health Maintenance, Female Adopting a healthy lifestyle and getting preventive care can go a long way to promote health and wellness. Talk with your health care provider about what schedule of regular examinations is right for you. This is a good chance for you to check in with your provider about disease prevention and staying healthy. In between checkups, there are plenty of things you can do on your own. Experts have done a lot of research about which lifestyle changes and preventive measures are most likely to keep you healthy. Ask your health care provider for more information. Weight and diet Eat a healthy diet  Be sure to include plenty of vegetables, fruits, low-fat dairy products, and lean protein.  Do not eat a lot of foods high in solid fats, added sugars, or salt.  Get regular exercise. This is one of the most important things you can do for your health. ? Most adults should exercise for at least 150 minutes each week. The exercise should increase your heart rate and make you sweat (moderate-intensity exercise). ? Most adults should also do strengthening exercises at least twice a week. This is in addition to the moderate-intensity exercise.  Maintain a healthy weight  Body mass index (BMI) is a measurement that can be used to identify possible weight problems. It estimates body fat based on height and weight. Your health care provider can help determine your BMI and help you achieve or maintain a healthy weight.  For females 82 years of age and older: ? A BMI below 18.5 is considered underweight. ? A BMI of 18.5 to 24.9 is normal. ? A BMI of 25 to 29.9 is considered overweight. ? A BMI of 30 and above is considered obese.  Watch levels of cholesterol and blood lipids  You should start having your blood tested for lipids and cholesterol at 31 years of age, then  have this test every 5 years.  You may need to have your cholesterol levels checked more often if: ? Your lipid or cholesterol levels are high. ? You are older than 31 years of age. ? You are at high risk for heart disease.  Cancer screening Lung Cancer  Lung cancer screening is recommended for adults 72-33 years old who are at high risk for lung cancer because of a history of smoking.  A yearly low-dose CT scan of the lungs is recommended for people who: ? Currently smoke. ? Have quit within the past 15 years. ? Have at least a 30-pack-year history of smoking. A pack year is smoking an average of one pack of cigarettes a day for 1 year.  Yearly screening should continue until it has been 15 years since you quit.  Yearly screening should stop if you develop a health problem that would prevent you from having lung cancer treatment.  Breast Cancer  Practice breast self-awareness. This means understanding how your breasts normally appear and feel.  It also means doing regular breast self-exams. Let your health care provider know about any changes, no matter how small.  If you are in your 20s or 30s, you should have a clinical breast exam (CBE) by a health care provider every 1-3 years as part of a regular health exam.  If you are 46 or older, have a CBE every year. Also consider having a breast X-ray (mammogram) every year.  If you have a family  history of breast cancer, talk to your health care provider about genetic screening.  If you are at high risk for breast cancer, talk to your health care provider about having an MRI and a mammogram every year.  Breast cancer gene (BRCA) assessment is recommended for women who have family members with BRCA-related cancers. BRCA-related cancers include: ? Breast. ? Ovarian. ? Tubal. ? Peritoneal cancers.  Results of the assessment will determine the need for genetic counseling and BRCA1 and BRCA2 testing.  Cervical Cancer Your health  care provider may recommend that you be screened regularly for cancer of the pelvic organs (ovaries, uterus, and vagina). This screening involves a pelvic examination, including checking for microscopic changes to the surface of your cervix (Pap test). You may be encouraged to have this screening done every 3 years, beginning at age 38.  For women ages 73-65, health care providers may recommend pelvic exams and Pap testing every 3 years, or they may recommend the Pap and pelvic exam, combined with testing for human papilloma virus (HPV), every 5 years. Some types of HPV increase your risk of cervical cancer. Testing for HPV may also be done on women of any age with unclear Pap test results.  Other health care providers may not recommend any screening for nonpregnant women who are considered low risk for pelvic cancer and who do not have symptoms. Ask your health care provider if a screening pelvic exam is right for you.  If you have had past treatment for cervical cancer or a condition that could lead to cancer, you need Pap tests and screening for cancer for at least 20 years after your treatment. If Pap tests have been discontinued, your risk factors (such as having a new sexual partner) need to be reassessed to determine if screening should resume. Some women have medical problems that increase the chance of getting cervical cancer. In these cases, your health care provider may recommend more frequent screening and Pap tests.  Colorectal Cancer  This type of cancer can be detected and often prevented.  Routine colorectal cancer screening usually begins at 31 years of age and continues through 31 years of age.  Your health care provider may recommend screening at an earlier age if you have risk factors for colon cancer.  Your health care provider may also recommend using home test kits to check for hidden blood in the stool.  A small camera at the end of a tube can be used to examine your colon  directly (sigmoidoscopy or colonoscopy). This is done to check for the earliest forms of colorectal cancer.  Routine screening usually begins at age 67.  Direct examination of the colon should be repeated every 5-10 years through 31 years of age. However, you may need to be screened more often if early forms of precancerous polyps or small growths are found.  Skin Cancer  Check your skin from head to toe regularly.  Tell your health care provider about any new moles or changes in moles, especially if there is a change in a mole's shape or color.  Also tell your health care provider if you have a mole that is larger than the size of a pencil eraser.  Always use sunscreen. Apply sunscreen liberally and repeatedly throughout the day.  Protect yourself by wearing long sleeves, pants, a wide-brimmed hat, and sunglasses whenever you are outside.  Heart disease, diabetes, and high blood pressure  High blood pressure causes heart disease and increases the risk of stroke.  High blood pressure is more likely to develop in: ? People who have blood pressure in the high end of the normal range (130-139/85-89 mm Hg). ? People who are overweight or obese. ? People who are African American.  If you are 69-78 years of age, have your blood pressure checked every 3-5 years. If you are 59 years of age or older, have your blood pressure checked every year. You should have your blood pressure measured twice-once when you are at a hospital or clinic, and once when you are not at a hospital or clinic. Record the average of the two measurements. To check your blood pressure when you are not at a hospital or clinic, you can use: ? An automated blood pressure machine at a pharmacy. ? A home blood pressure monitor.  If you are between 71 years and 74 years old, ask your health care provider if you should take aspirin to prevent strokes.  Have regular diabetes screenings. This involves taking a blood sample to  check your fasting blood sugar level. ? If you are at a normal weight and have a low risk for diabetes, have this test once every three years after 31 years of age. ? If you are overweight and have a high risk for diabetes, consider being tested at a younger age or more often. Preventing infection Hepatitis B  If you have a higher risk for hepatitis B, you should be screened for this virus. You are considered at high risk for hepatitis B if: ? You were born in a country where hepatitis B is common. Ask your health care provider which countries are considered high risk. ? Your parents were born in a high-risk country, and you have not been immunized against hepatitis B (hepatitis B vaccine). ? You have HIV or AIDS. ? You use needles to inject street drugs. ? You live with someone who has hepatitis B. ? You have had sex with someone who has hepatitis B. ? You get hemodialysis treatment. ? You take certain medicines for conditions, including cancer, organ transplantation, and autoimmune conditions.  Hepatitis C  Blood testing is recommended for: ? Everyone born from [birth year] through 1964. ? Anyone with known risk factors for hepatitis C.  Sexually transmitted infections (STIs)  You should be screened for sexually transmitted infections (STIs) including gonorrhea and chlamydia if: ? You are sexually active and are younger than 31 years of age. ? You are older than 31 years of age and your health care provider tells you that you are at risk for this type of infection. ? Your sexual activity has changed since you were last screened and you are at an increased risk for chlamydia or gonorrhea. Ask your health care provider if you are at risk.  If you do not have HIV, but are at risk, it may be recommended that you take a prescription medicine daily to prevent HIV infection. This is called pre-exposure prophylaxis (PrEP). You are considered at risk if: ? You are sexually active and do not regularly  use condoms or know the HIV status of your partner(s). ? You take drugs by injection. ? You are sexually active with a partner who has HIV.  Talk with your health care provider about whether you are at high risk of being infected with HIV. If you choose to begin PrEP, you should first be tested for HIV. You should then be tested every 3 months for as long as you are taking PrEP. Pregnancy  If you  are premenopausal and you may become pregnant, ask your health care provider about preconception counseling.  If you may become pregnant, take 400 to 800 micrograms (mcg) of folic acid every day.  If you want to prevent pregnancy, talk to your health care provider about birth control (contraception). Osteoporosis and menopause  Osteoporosis is a disease in which the bones lose minerals and strength with aging. This can result in serious bone fractures. Your risk for osteoporosis can be identified using a bone density scan.  If you are 61 years of age or older, or if you are at risk for osteoporosis and fractures, ask your health care provider if you should be screened.  Ask your health care provider whether you should take a calcium or vitamin D supplement to lower your risk for osteoporosis.  Menopause may have certain physical symptoms and risks.  Hormone replacement therapy may reduce some of these symptoms and risks. Talk to your health care provider about whether hormone replacement therapy is right for you. Follow these instructions at home:  Schedule regular health, dental, and eye exams.  Stay current with your immunizations.  Do not use any tobacco products including cigarettes, chewing tobacco, or electronic cigarettes.  If you are pregnant, do not drink alcohol.  If you are breastfeeding, limit how much and how often you drink alcohol.  Limit alcohol intake to no more than 1 drink per day for nonpregnant women. One drink equals 12 ounces of beer, 5 ounces of wine, or 1 ounces  of hard liquor.  Do not use street drugs.  Do not share needles.  Ask your health care provider for help if you need support or information about quitting drugs.  Tell your health care provider if you often feel depressed.  Tell your health care provider if you have ever been abused or do not feel safe at home. This information is not intended to replace advice given to you by your health care provider. Make sure you discuss any questions you have with your health care provider. Document Released: 06/15/2011 Document Revised: 05/07/2016 Document Reviewed: 09/03/2015 Elsevier Interactive Patient Education  [employer].

## 2018-07-19 ENCOUNTER — Encounter: Payer: Self-pay | Admitting: Nurse Practitioner

## 2018-07-22 ENCOUNTER — Encounter: Payer: Self-pay | Admitting: Nurse Practitioner

## 2018-07-25 ENCOUNTER — Encounter: Payer: Self-pay | Admitting: Nurse Practitioner

## 2018-08-24 ENCOUNTER — Ambulatory Visit (INDEPENDENT_AMBULATORY_CARE_PROVIDER_SITE_OTHER): Payer: 59

## 2018-08-24 DIAGNOSIS — Z23 Encounter for immunization: Secondary | ICD-10-CM

## 2018-10-25 ENCOUNTER — Encounter: Payer: Self-pay | Admitting: Nurse Practitioner

## 2018-10-25 ENCOUNTER — Other Ambulatory Visit (INDEPENDENT_AMBULATORY_CARE_PROVIDER_SITE_OTHER): Payer: 59

## 2018-10-25 DIAGNOSIS — R748 Abnormal levels of other serum enzymes: Secondary | ICD-10-CM

## 2018-10-25 LAB — HEPATIC FUNCTION PANEL
ALT: 273 U/L — AB (ref 0–35)
AST: 79 U/L — ABNORMAL HIGH (ref 0–37)
Albumin: 4.2 g/dL (ref 3.5–5.2)
Alkaline Phosphatase: 126 U/L — ABNORMAL HIGH (ref 39–117)
Bilirubin, Direct: 0.1 mg/dL (ref 0.0–0.3)
TOTAL PROTEIN: 6.7 g/dL (ref 6.0–8.3)
Total Bilirubin: 0.4 mg/dL (ref 0.2–1.2)

## 2018-10-31 ENCOUNTER — Other Ambulatory Visit (INDEPENDENT_AMBULATORY_CARE_PROVIDER_SITE_OTHER): Payer: 59

## 2018-10-31 DIAGNOSIS — R748 Abnormal levels of other serum enzymes: Secondary | ICD-10-CM | POA: Diagnosis not present

## 2018-10-31 LAB — HEPATIC FUNCTION PANEL
ALK PHOS: 247 U/L — AB (ref 39–117)
ALT: 1733 U/L — AB (ref 0–35)
AST: 482 U/L — AB (ref 0–37)
Albumin: 4.4 g/dL (ref 3.5–5.2)
BILIRUBIN DIRECT: 0.4 mg/dL — AB (ref 0.0–0.3)
TOTAL PROTEIN: 7.1 g/dL (ref 6.0–8.3)
Total Bilirubin: 1.1 mg/dL (ref 0.2–1.2)

## 2018-11-02 ENCOUNTER — Other Ambulatory Visit: Payer: Self-pay | Admitting: Nurse Practitioner

## 2018-11-02 DIAGNOSIS — K8020 Calculus of gallbladder without cholecystitis without obstruction: Secondary | ICD-10-CM | POA: Diagnosis not present

## 2018-11-02 DIAGNOSIS — B179 Acute viral hepatitis, unspecified: Secondary | ICD-10-CM | POA: Diagnosis not present

## 2018-11-04 ENCOUNTER — Ambulatory Visit
Admission: RE | Admit: 2018-11-04 | Discharge: 2018-11-04 | Disposition: A | Discharge status: Home / Self Care | Payer: 59 | Source: Ambulatory Visit | Attending: Nurse Practitioner | Admitting: Nurse Practitioner

## 2018-11-04 DIAGNOSIS — B179 Acute viral hepatitis, unspecified: Secondary | ICD-10-CM

## 2018-11-04 DIAGNOSIS — K8020 Calculus of gallbladder without cholecystitis without obstruction: Secondary | ICD-10-CM

## 2018-11-04 DIAGNOSIS — K828 Other specified diseases of gallbladder: Secondary | ICD-10-CM | POA: Diagnosis not present

## 2018-11-07 MED FILL — PREVIDENT 5000 BOOSTER PLUS: 1.1 | 90 days supply | Qty: 300 | Fill #0

## 2018-11-25 DIAGNOSIS — Z8349 Family history of other endocrine, nutritional and metabolic diseases: Secondary | ICD-10-CM | POA: Diagnosis not present

## 2018-11-25 DIAGNOSIS — K8020 Calculus of gallbladder without cholecystitis without obstruction: Secondary | ICD-10-CM | POA: Diagnosis not present

## 2018-11-25 DIAGNOSIS — R945 Abnormal results of liver function studies: Secondary | ICD-10-CM | POA: Diagnosis not present

## 2018-12-05 DIAGNOSIS — Z8349 Family history of other endocrine, nutritional and metabolic diseases: Secondary | ICD-10-CM | POA: Diagnosis not present

## 2018-12-05 DIAGNOSIS — R740 Nonspecific elevation of levels of transaminase and lactic acid dehydrogenase [LDH]: Secondary | ICD-10-CM | POA: Diagnosis not present

## 2018-12-14 HISTORY — PX: DIAGNOSTIC LAPAROSCOPIC LIVER BIOPSY: SHX5797

## 2018-12-15 ENCOUNTER — Other Ambulatory Visit: Payer: Self-pay | Admitting: General Surgery

## 2018-12-15 DIAGNOSIS — Z8349 Family history of other endocrine, nutritional and metabolic diseases: Secondary | ICD-10-CM | POA: Diagnosis not present

## 2018-12-15 DIAGNOSIS — K8020 Calculus of gallbladder without cholecystitis without obstruction: Secondary | ICD-10-CM | POA: Diagnosis present

## 2018-12-15 DIAGNOSIS — R7989 Other specified abnormal findings of blood chemistry: Secondary | ICD-10-CM | POA: Diagnosis not present

## 2018-12-15 DIAGNOSIS — K8010 Calculus of gallbladder with chronic cholecystitis without obstruction: Secondary | ICD-10-CM | POA: Diagnosis not present

## 2018-12-27 NOTE — Pre-Procedure Instructions (Signed)
Kathy Blackwell  12/27/2018      [place], [place] - [place] [address] Phone: [phone] Fax: [phone]     Your procedure is scheduled on 01/04/2019.  Report to [facility], [facility], [facility] Admitting at 0700 A.M.  Call this number if you have problems the morning of surgery:  [phone]   Remember:  Do not eat after midnight.  You may drink clear liquids until 0600 .  Clear liquids allowed are: Water, Juice (non-citric and without pulp), Carbonated beverages, Clear Tea, Black Coffee only and Gatorade    Take these medicines the morning of surgery with A SIP OF WATER: NONE    Do not wear jewelry, make-up or nail polish.  Do not wear lotions, powders, or perfumes, or deodorant.  Do not shave 48 hours prior to surgery.    Do not bring valuables to the hospital.  [facility] is not responsible for any belongings or valuables.  Contacts, eyeglasses, hearing aids, dentures or bridgework may not be worn into surgery.  Leave your suitcase in the car.  After surgery it may be brought to your room.  For patients admitted to the hospital, discharge time will be determined by your treatment team.  Patients discharged the day of surgery will not be allowed to drive home.   Name and phone number of your driver:    Special instructions:   [place]- Preparing For Surgery  Before surgery, you can play an important role. Because skin is not sterile, your skin needs to be as free of germs as possible. You can reduce the number of germs on your skin by washing with CHG (chlorahexidine gluconate) Soap before surgery.  CHG is an antiseptic cleaner which kills germs and bonds with the skin to continue killing germs even after washing.    Oral Hygiene is also important to reduce your risk of infection.  Remember - BRUSH YOUR TEETH THE MORNING OF SURGERY WITH YOUR REGULAR TOOTHPASTE  Please do not use if you  have an allergy to CHG or antibacterial soaps. If your skin becomes reddened/irritated stop using the CHG.  Do not shave (including legs and underarms) for at least 48 hours prior to first CHG shower. It is OK to shave your face.  Please follow these instructions carefully.   1. Shower the NIGHT BEFORE SURGERY and the MORNING OF SURGERY with CHG.   2. If you chose to wash your hair, wash your hair first as usual with your normal shampoo.  3. After you shampoo, rinse your hair and body thoroughly to remove the shampoo.  4. Use CHG as you would any other liquid soap. You can apply CHG directly to the skin and wash gently with a scrungie or a clean washcloth.   5. Apply the CHG Soap to your body ONLY FROM THE NECK DOWN.  Do not use on open wounds or open sores. Avoid contact with your eyes, ears, mouth and genitals (private parts). Wash Face and genitals (private parts)  with your normal soap.  6. Wash thoroughly, paying special attention to the area where your surgery will be performed.  7. Thoroughly rinse your body with warm water from the neck down.  8. DO NOT shower/wash with your normal soap after using and rinsing off the CHG Soap.  9. Pat yourself dry with a CLEAN TOWEL.  10. Wear CLEAN PAJAMAS to bed the night before surgery, wear comfortable  clothes the morning of surgery  11. Place CLEAN SHEETS on your bed the night of your first shower and DO NOT SLEEP WITH PETS.    Day of Surgery: Shower as stated above. Do not apply any deodorants/lotions.  Please wear clean clothes to the hospital/surgery center.   Remember to brush your teeth WITH YOUR REGULAR TOOTHPASTE.    Please read over the following fact sheets that you were given.

## 2018-12-28 ENCOUNTER — Other Ambulatory Visit: Payer: Self-pay

## 2018-12-28 ENCOUNTER — Encounter (HOSPITAL_COMMUNITY): Payer: Self-pay

## 2018-12-28 ENCOUNTER — Encounter (HOSPITAL_COMMUNITY)
Admission: RE | Admit: 2018-12-28 | Discharge: 2018-12-28 | Disposition: A | Discharge status: Home / Self Care | Payer: 59 | Source: Ambulatory Visit | Attending: General Surgery | Admitting: General Surgery

## 2018-12-28 DIAGNOSIS — Z01812 Encounter for preprocedural laboratory examination: Secondary | ICD-10-CM | POA: Insufficient documentation

## 2018-12-28 LAB — CBC WITH DIFFERENTIAL/PLATELET
Abs Immature Granulocytes: 0.02 10*3/uL (ref 0.00–0.07)
Basophils Absolute: 0 10*3/uL (ref 0.0–0.1)
Basophils Relative: 1 %
EOS PCT: 1 %
Eosinophils Absolute: 0.1 10*3/uL (ref 0.0–0.5)
HCT: 45.2 % (ref 36.0–46.0)
Hemoglobin: 14.3 g/dL (ref 12.0–15.0)
Immature Granulocytes: 0 %
Lymphocytes Relative: 19 %
Lymphs Abs: 1.4 10*3/uL (ref 0.7–4.0)
MCH: 28.7 pg (ref 26.0–34.0)
MCHC: 31.6 g/dL (ref 30.0–36.0)
MCV: 90.6 fL (ref 80.0–100.0)
MONOS PCT: 5 %
Monocytes Absolute: 0.4 10*3/uL (ref 0.1–1.0)
Neutro Abs: 5.6 10*3/uL (ref 1.7–7.7)
Neutrophils Relative %: 74 %
Platelets: 249 10*3/uL (ref 150–400)
RBC: 4.99 MIL/uL (ref 3.87–5.11)
RDW: 12.2 % (ref 11.5–15.5)
WBC: 7.6 10*3/uL (ref 4.0–10.5)
nRBC: 0 % (ref 0.0–0.2)

## 2018-12-28 LAB — COMPREHENSIVE METABOLIC PANEL
ALT: 98 U/L — ABNORMAL HIGH (ref 0–44)
ANION GAP: 9 (ref 5–15)
AST: 47 U/L — ABNORMAL HIGH (ref 15–41)
Albumin: 4.1 g/dL (ref 3.5–5.0)
Alkaline Phosphatase: 73 U/L (ref 38–126)
BUN: 11 mg/dL (ref 6–20)
CHLORIDE: 106 mmol/L (ref 98–111)
CO2: 24 mmol/L (ref 22–32)
Calcium: 9.5 mg/dL (ref 8.9–10.3)
Creatinine, Ser: 0.79 mg/dL (ref 0.44–1.00)
GFR calc Af Amer: >60 mL/min (ref >60)
Glucose, Bld: 101 mg/dL — ABNORMAL HIGH (ref 70–99)
Potassium: 4.3 mmol/L (ref 3.5–5.1)
Sodium: 139 mmol/L (ref 135–145)
Total Bilirubin: 0.8 mg/dL (ref 0.3–1.2)
Total Protein: 6.9 g/dL (ref 6.5–8.1)

## 2018-12-28 NOTE — Progress Notes (Signed)
PCP - Wilfred Lacy NP  Chest x-ray - N/A EKG - N/A  Blood Thinner Instructions: N/A Aspirin Instructions: N/A  Anesthesia review: none  Patient denies shortness of breath, fever, cough and chest pain at PAT appointment   Patient verbalized understanding of instructions that were given to them at the PAT appointment. Patient was also instructed that they will need to review over the PAT instructions again at home before surgery.

## 2019-01-02 NOTE — H&P (Signed)
Kathy Blackwell Dr. Location: [facility] Patient #: [id number] DOB: [birth year] Married / Language: English / Race: White Female   History of Present Illness  The patient is a 33 year old female who presents for evaluation of gall stones. Patient is a 33 year old female referred for consultation by Roosevelt Locks, nurse practitioner, regarding cholelithiasis. Dr. Sharlet Salina is a primary care physician in the [facility] system. She has been being seen by the hepatology clinic for several years for elevated liver function tests. 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 Her AST and ALT were primarily elevated. This got significantly better with all her liver function tests coming down to near normal. Around a year and a half ago these were elevated again due to fertility medications and pregnancy. She had itching and cholestasis of pregnancy. She has had known gallstones throughout this time due to ultrasound and MRCP. She has also had 2 liver biopsies which were negative for cirrhosis.  Of note, her brother was just diagnosed with Wilson's disease. He has had to get a liver transplant.  With regard to abdominal pain symptoms, the patient has had several episodes of intense abdominal pain that occurred after exercise. She has not noted pain with eating. However, with 1 of her episodes of pain she had significant nausea and threw up several times. Most of the episodes have been short around 5 minutes, but the last one was several hours long. She has had an elevated alkaline phosphatase some of the time, but it has been associated with much higher elevations of AST and ALT.   [place] abd 11/04/2018 IMPRESSION: Solitary gallstone and sludge is noted in gallbladder lumen. No definite gallbladder wall thickening or pericholecystic fluid is noted.  No other abnormality seen in the abdomen.  MRCP 10/31/17 IMPRESSION: 1. Cholelithiasis  without choledocholithiasis. No intra or extrahepatic biliary duct dilatation. 2. Wedge-shaped area of differential enhancement in the anterior right liver without definite underlying mass lesion. Anomalous perfusion secondary to underlying vascular malformation would be a consideration. There is a tiny linear hypoenhancing focus near the apex of this wedge-shaped area of early hyperenhancement, nonspecific but tiny vascular thrombosis or a tiny focus of ductal dilatation could have this appearance. Consider repeat MRI in 3-6 months to ensure stability. 3. 4 mm probable complex cyst lateral segment left liver.   Allergies No Known Drug Allergies [07/18/2015]:  Medication History No Current Medications Medications Reconciled    Review of Systems All other systems negative  Vitals Weight: 124.38 lb Height: 62in Body Surface Area: 1.56 m Body Mass Index: 22.75 kg/m  Temp.: 43F(Oral)  Pulse: 99 (Regular)  BP: 124/86 (Sitting, Left Arm, Standard)       Physical Exam General Mental Status-Alert. General Appearance-Consistent with stated age. Hydration-Well hydrated. Voice-Normal. Note: thin, healthy appearing.   Head and Neck Head-normocephalic, atraumatic with no lesions or palpable masses. Trachea-midline. Thyroid Gland Characteristics - normal size and consistency.  Eye Eyeball - Bilateral-Extraocular movements intact. Sclera/Conjunctiva - Bilateral-No scleral icterus.  Chest and Lung Exam Chest and lung exam reveals -quiet, even and easy respiratory effort with no use of accessory muscles and on auscultation, normal breath sounds, no adventitious sounds and normal vocal resonance. Inspection Chest Wall - Normal. Back - normal.  Cardiovascular Cardiovascular examination reveals -normal heart sounds, regular rate and rhythm with no murmurs and normal pedal pulses bilaterally.  Abdomen Inspection Inspection of the  abdomen reveals - No Hernias. Palpation/Percussion Palpation  and Percussion of the abdomen reveal - Soft, Non Tender, No Rebound tenderness, No Rigidity (guarding) and No hepatosplenomegaly. Auscultation Auscultation of the abdomen reveals - Bowel sounds normal.  Neurologic Neurologic evaluation reveals -alert and oriented x 3 with no impairment of recent or remote memory. Mental Status-Normal.  Musculoskeletal Global Assessment -Note: no gross deformities.  Normal Exam - Left-Upper Extremity Strength Normal and Lower Extremity Strength Normal. Normal Exam - Right-Upper Extremity Strength Normal and Lower Extremity Strength Normal.  Lymphatic Head & Neck  General Head & Neck Lymphatics: Bilateral - Description - Normal. Axillary  General Axillary Region: Bilateral - Description - Normal. Tenderness - Non Tender. Femoral & Inguinal  Generalized Femoral & Inguinal Lymphatics: Bilateral - Description - No Generalized lymphadenopathy.    Assessment & Plan CHOLELITHIASIS WITHOUT CHOLECYSTITIS (K80.20) Impression: Patient has some symptoms that appear to be gallbladder related such as the episodes of pain and nausea. The patient also has had many first and second-degree relatives that have required cholecystectomy. I think it is reasonable to take out her gallbladder given her pain and the family history. She does not have an indication that we absolutely must take out her gallbladder at this time, I discussed thinking about timing with her family.  I discussed the risk of surgery and reviewed anatomy and surgical incisions with the patient and significant other. I reviewed recovery expectations. We are probably looking at scheduling in February or March. I advised to call in January after the holidays to get this put on. Current Plans Pt Education - Pamphlet Given - Laparoscopic Gallbladder Surgery: discussed with patient and provided information. ELEVATED LIVER FUNCTION  TESTS (R94.5) Impression: May be related to gallbladder, but more likely not. FAMILY HISTORY OF WILSON'S DISEASE (Z83.49) Impression: Needs core needle biopsy at time of surgery.    Signed by Stark Klein, MD

## 2019-01-03 NOTE — Anesthesia Preprocedure Evaluation (Addendum)
Anesthesia Evaluation  Patient identified by MRN, date of birth, ID band Patient awake    Reviewed: Allergy & Precautions, NPO status , Patient's Chart, lab work & pertinent test results  Airway Mallampati: II  TM Distance: >3 FB Neck ROM: Full    Dental  (+) Teeth Intact, Dental Advisory Given   Pulmonary neg pulmonary ROS,    Pulmonary exam normal breath sounds clear to auscultation       Cardiovascular Exercise Tolerance: Good negative cardio ROS Normal cardiovascular exam Rhythm:Regular Rate:Normal     Neuro/Psych negative neurological ROS     GI/Hepatic negative GI ROS, Gallstones, Elevated liver tests   Endo/Other  negative endocrine ROS  Renal/GU negative Renal ROS     Musculoskeletal negative musculoskeletal ROS (+)   Abdominal   Peds  Hematology negative hematology ROS (+)   Anesthesia Other Findings Day of surgery medications reviewed with the patient.  Reproductive/Obstetrics                           Anesthesia Physical Anesthesia Plan  ASA: I  Anesthesia Plan: General   Post-op Pain Management:    Induction: Intravenous  PONV Risk Score and Plan: 4 or greater and Diphenhydramine, Scopolamine patch - Pre-op, Midazolam, Dexamethasone and Ondansetron  Airway Management Planned: Oral ETT  Additional Equipment:   Intra-op Plan:   Post-operative Plan: Extubation in OR  Informed Consent: I have reviewed the patients History and Physical, chart, labs and discussed the procedure including the risks, benefits and alternatives for the proposed anesthesia with the patient or authorized representative who has indicated his/her understanding and acceptance.     Dental advisory given  Plan Discussed with: CRNA  Anesthesia Plan Comments:       Anesthesia Quick Evaluation

## 2019-01-04 ENCOUNTER — Encounter (HOSPITAL_COMMUNITY)
Admission: RE | Disposition: A | Discharge status: Home / Self Care | Payer: Self-pay | Source: Home / Self Care | Attending: General Surgery

## 2019-01-04 ENCOUNTER — Encounter (HOSPITAL_COMMUNITY): Payer: Self-pay | Admitting: Certified Registered Nurse Anesthetist

## 2019-01-04 ENCOUNTER — Ambulatory Visit (HOSPITAL_COMMUNITY)
Admission: RE | Admit: 2019-01-04 | Discharge: 2019-01-04 | Disposition: A | Discharge status: Home / Self Care | Payer: 59 | Attending: General Surgery | Admitting: General Surgery

## 2019-01-04 ENCOUNTER — Ambulatory Visit (HOSPITAL_COMMUNITY): Payer: 59 | Admitting: Anesthesiology

## 2019-01-04 DIAGNOSIS — K8020 Calculus of gallbladder without cholecystitis without obstruction: Secondary | ICD-10-CM | POA: Diagnosis not present

## 2019-01-04 DIAGNOSIS — R945 Abnormal results of liver function studies: Secondary | ICD-10-CM | POA: Diagnosis not present

## 2019-01-04 DIAGNOSIS — R7989 Other specified abnormal findings of blood chemistry: Secondary | ICD-10-CM | POA: Insufficient documentation

## 2019-01-04 DIAGNOSIS — Z8349 Family history of other endocrine, nutritional and metabolic diseases: Secondary | ICD-10-CM | POA: Insufficient documentation

## 2019-01-04 DIAGNOSIS — K7689 Other specified diseases of liver: Secondary | ICD-10-CM | POA: Diagnosis not present

## 2019-01-04 DIAGNOSIS — K8010 Calculus of gallbladder with chronic cholecystitis without obstruction: Secondary | ICD-10-CM | POA: Diagnosis not present

## 2019-01-04 HISTORY — PX: LIVER BIOPSY: SHX301

## 2019-01-04 HISTORY — PX: CHOLECYSTECTOMY: SHX55

## 2019-01-04 LAB — POCT PREGNANCY, URINE: Preg Test, Ur: NEGATIVE

## 2019-01-04 SURGERY — LAPAROSCOPIC CHOLECYSTECTOMY WITH INTRAOPERATIVE CHOLANGIOGRAM
Anesthesia: General

## 2019-01-04 MED ORDER — PROPOFOL 10 MG/ML IV BOLUS
INTRAVENOUS | Status: DC | PRN
  Administered 2019-01-04: 150 mg via INTRAVENOUS

## 2019-01-04 MED ORDER — GABAPENTIN 300 MG PO CAPS
300.0000 mg | ORAL_CAPSULE | ORAL | Status: DC
  Filled 2019-01-04: qty 1

## 2019-01-04 MED ORDER — DEXAMETHASONE SODIUM PHOSPHATE 10 MG/ML IJ SOLN
INTRAMUSCULAR | Status: DC | PRN
  Administered 2019-01-04: 8 mg via INTRAVENOUS

## 2019-01-04 MED ORDER — MIDAZOLAM HCL 2 MG/2ML IJ SOLN
INTRAMUSCULAR | Status: AC
  Filled 2019-01-04: qty 2

## 2019-01-04 MED ORDER — ONDANSETRON HCL 4 MG/2ML IJ SOLN
INTRAMUSCULAR | Status: AC
  Filled 2019-01-04: qty 2

## 2019-01-04 MED ORDER — CEFAZOLIN SODIUM-DEXTROSE 2-4 GM/100ML-% IV SOLN
2.0000 g | INTRAVENOUS | Status: DC
  Filled 2019-01-04: qty 100

## 2019-01-04 MED ORDER — KETAMINE HCL 10 MG/ML IJ SOLN
INTRAMUSCULAR | Status: DC | PRN
  Administered 2019-01-04: 30 mg via INTRAVENOUS

## 2019-01-04 MED ORDER — CHLORHEXIDINE GLUCONATE CLOTH 2 % EX PADS: 6.0000 | MEDICATED_PAD | Freq: Once | CUTANEOUS | Status: DC

## 2019-01-04 MED ORDER — 0.9 % SODIUM CHLORIDE (POUR BTL) OPTIME
TOPICAL | Status: DC | PRN
  Administered 2019-01-04: 1000 mL

## 2019-01-04 MED ORDER — SUGAMMADEX SODIUM 200 MG/2ML IV SOLN
INTRAVENOUS | Status: DC | PRN
  Administered 2019-01-04: 113.4 mg via INTRAVENOUS

## 2019-01-04 MED ORDER — SUCCINYLCHOLINE CHLORIDE 200 MG/10ML IV SOSY
PREFILLED_SYRINGE | INTRAVENOUS | Status: AC
  Filled 2019-01-04: qty 10

## 2019-01-04 MED ORDER — FENTANYL CITRATE (PF) 100 MCG/2ML IJ SOLN: 25.0000 ug | INTRAMUSCULAR | Status: DC | PRN

## 2019-01-04 MED ORDER — PHENYLEPHRINE 40 MCG/ML (10ML) SYRINGE FOR IV PUSH (FOR BLOOD PRESSURE SUPPORT)
PREFILLED_SYRINGE | INTRAVENOUS | Status: AC
  Filled 2019-01-04: qty 10

## 2019-01-04 MED ORDER — KETAMINE HCL 50 MG/5ML IJ SOSY
PREFILLED_SYRINGE | INTRAMUSCULAR | Status: AC
  Filled 2019-01-04: qty 5

## 2019-01-04 MED ORDER — LIDOCAINE 20MG/ML (2%) 15 ML SYRINGE OPTIME
INTRAMUSCULAR | Status: DC | PRN
  Administered 2019-01-04: 60 mg via INTRAVENOUS

## 2019-01-04 MED ORDER — BUPIVACAINE-EPINEPHRINE (PF) 0.25% -1:200000 IJ SOLN
INTRAMUSCULAR | Status: AC
  Filled 2019-01-04: qty 30

## 2019-01-04 MED ORDER — LIDOCAINE 2% (20 MG/ML) 5 ML SYRINGE
INTRAMUSCULAR | Status: AC
  Filled 2019-01-04: qty 5

## 2019-01-04 MED ORDER — ROCURONIUM BROMIDE 10 MG/ML (PF) SYRINGE
PREFILLED_SYRINGE | INTRAVENOUS | Status: DC | PRN
  Administered 2019-01-04: 10 mg via INTRAVENOUS
  Administered 2019-01-04: 40 mg via INTRAVENOUS

## 2019-01-04 MED ORDER — ACETAMINOPHEN 500 MG PO TABS
1000.0000 mg | ORAL_TABLET | ORAL | Status: AC
  Administered 2019-01-04: 1000 mg via ORAL
  Filled 2019-01-04: qty 2

## 2019-01-04 MED ORDER — LIDOCAINE HCL (PF) 1 % IJ SOLN
INTRAMUSCULAR | Status: AC
  Filled 2019-01-04: qty 30

## 2019-01-04 MED ORDER — FENTANYL CITRATE (PF) 250 MCG/5ML IJ SOLN
INTRAMUSCULAR | Status: AC
  Filled 2019-01-04: qty 5

## 2019-01-04 MED ORDER — PROMETHAZINE HCL 25 MG/ML IJ SOLN: 6.2500 mg | INTRAMUSCULAR | Status: DC | PRN

## 2019-01-04 MED ORDER — SODIUM CHLORIDE 0.9 % IR SOLN
Status: DC | PRN
  Administered 2019-01-04: 1

## 2019-01-04 MED ORDER — DEXAMETHASONE SODIUM PHOSPHATE 10 MG/ML IJ SOLN
INTRAMUSCULAR | Status: AC
  Filled 2019-01-04: qty 1

## 2019-01-04 MED ORDER — MIDAZOLAM HCL 5 MG/5ML IJ SOLN
INTRAMUSCULAR | Status: DC | PRN
  Administered 2019-01-04: 2 mg via INTRAVENOUS

## 2019-01-04 MED ORDER — LACTATED RINGERS IV SOLN
INTRAVENOUS | Status: DC
  Administered 2019-01-04: 08:00:00 via INTRAVENOUS

## 2019-01-04 MED ORDER — GABAPENTIN 100 MG PO CAPS
ORAL_CAPSULE | ORAL | Status: AC
  Filled 2019-01-04: qty 1

## 2019-01-04 MED ORDER — IOPAMIDOL (ISOVUE-300) INJECTION 61%
INTRAVENOUS | Status: AC
  Filled 2019-01-04: qty 50

## 2019-01-04 MED ORDER — LIDOCAINE HCL 1 % IJ SOLN
INTRAMUSCULAR | Status: DC | PRN
  Administered 2019-01-04: 15 mL via INTRADERMAL

## 2019-01-04 MED ORDER — PROPOFOL 10 MG/ML IV BOLUS
INTRAVENOUS | Status: AC
  Filled 2019-01-04: qty 40

## 2019-01-04 MED ORDER — ONDANSETRON HCL 4 MG/2ML IJ SOLN
INTRAMUSCULAR | Status: DC | PRN
  Administered 2019-01-04: 4 mg via INTRAVENOUS

## 2019-01-04 MED ORDER — EPHEDRINE 5 MG/ML INJ
INTRAVENOUS | Status: AC
  Filled 2019-01-04: qty 10

## 2019-01-04 MED ORDER — OXYCODONE HCL 5 MG PO TABS: 5.0000 mg | ORAL_TABLET | Freq: Four times a day (QID) | ORAL | 0 refills | Status: DC | PRN

## 2019-01-04 MED ORDER — GABAPENTIN 100 MG PO CAPS
100.0000 mg | ORAL_CAPSULE | Freq: Once | ORAL | Status: AC
  Administered 2019-01-04: 100 mg via ORAL

## 2019-01-04 MED ORDER — FENTANYL CITRATE (PF) 100 MCG/2ML IJ SOLN
INTRAMUSCULAR | Status: DC | PRN
  Administered 2019-01-04: 75 ug via INTRAVENOUS
  Administered 2019-01-04: 50 ug via INTRAVENOUS
  Administered 2019-01-04: 25 ug via INTRAVENOUS
  Administered 2019-01-04: 50 ug via INTRAVENOUS

## 2019-01-04 SURGICAL SUPPLY — 46 items
APPLIER CLIP ROT 10 11.4 M/L (STAPLE) ×3
BLADE CLIPPER SURG (BLADE) IMPLANT
CANISTER SUCT 3000ML PPV (MISCELLANEOUS) ×3 IMPLANT
CHLORAPREP W/TINT 26ML (MISCELLANEOUS) ×3 IMPLANT
CLIP APPLIE ROT 10 11.4 M/L (STAPLE) ×1 IMPLANT
COVER MAYO STAND STRL (DRAPES) IMPLANT
COVER SURGICAL LIGHT HANDLE (MISCELLANEOUS) ×6 IMPLANT
COVER WAND RF STERILE (DRAPES) IMPLANT
DERMABOND ADVANCED (GAUZE/BANDAGES/DRESSINGS) ×2
DERMABOND ADVANCED .7 DNX12 (GAUZE/BANDAGES/DRESSINGS) ×1 IMPLANT
DRAPE C-ARM 42X72 X-RAY (DRAPES) IMPLANT
DRAPE WARM FLUID 44X44 (DRAPE) ×3 IMPLANT
DRSG TELFA 3X8 NADH (GAUZE/BANDAGES/DRESSINGS) ×3 IMPLANT
ELECT REM PT RETURN 9FT ADLT (ELECTROSURGICAL) ×3
ELECTRODE REM PT RTRN 9FT ADLT (ELECTROSURGICAL) ×1 IMPLANT
FILTER SMOKE EVAC LAPAROSHD (FILTER) IMPLANT
GLOVE BIO SURGEON STRL SZ 6 (GLOVE) ×3 IMPLANT
GLOVE INDICATOR 6.5 STRL GRN (GLOVE) ×3 IMPLANT
GOWN STRL REUS W/ TWL LRG LVL3 (GOWN DISPOSABLE) ×3 IMPLANT
GOWN STRL REUS W/TWL 2XL LVL3 (GOWN DISPOSABLE) ×3 IMPLANT
GOWN STRL REUS W/TWL LRG LVL3 (GOWN DISPOSABLE) ×6
INSTRUMENT COAXIAL 18GX20CM (NEEDLE) ×3 IMPLANT
KIT BASIN OR (CUSTOM PROCEDURE TRAY) ×3 IMPLANT
KIT TURNOVER KIT B (KITS) ×3 IMPLANT
L-HOOK LAP DISP 36CM (ELECTROSURGICAL) ×3
LHOOK LAP DISP 36CM (ELECTROSURGICAL) ×1 IMPLANT
NEEDLE COAXIAL INTRO 17X16.8CM (NEEDLE) ×3 IMPLANT
NS IRRIG 1000ML POUR BTL (IV SOLUTION) ×3 IMPLANT
PAD ARMBOARD 7.5X6 YLW CONV (MISCELLANEOUS) ×3 IMPLANT
PENCIL BUTTON HOLSTER BLD 10FT (ELECTRODE) ×3 IMPLANT
POUCH RETRIEVAL ECOSAC 10 (ENDOMECHANICALS) ×1 IMPLANT
POUCH RETRIEVAL ECOSAC 10MM (ENDOMECHANICALS) ×2
SCISSORS LAP 5X35 DISP (ENDOMECHANICALS) ×3 IMPLANT
SET CHOLANGIOGRAPH 5 50 .035 (SET/KITS/TRAYS/PACK) IMPLANT
SET IRRIG TUBING LAPAROSCOPIC (IRRIGATION / IRRIGATOR) ×3 IMPLANT
SET TUBE SMOKE EVAC HIGH FLOW (TUBING) ×3 IMPLANT
SLEEVE ENDOPATH XCEL 5M (ENDOMECHANICALS) ×3 IMPLANT
SPECIMEN JAR SMALL (MISCELLANEOUS) ×6 IMPLANT
SUT MNCRL AB 4-0 PS2 18 (SUTURE) ×6 IMPLANT
TOWEL OR 17X24 6PK STRL BLUE (TOWEL DISPOSABLE) ×3 IMPLANT
TOWEL OR 17X26 10 PK STRL BLUE (TOWEL DISPOSABLE) IMPLANT
TRAY LAPAROSCOPIC MC (CUSTOM PROCEDURE TRAY) ×3 IMPLANT
TROCAR XCEL BLUNT TIP 100MML (ENDOMECHANICALS) ×3 IMPLANT
TROCAR XCEL NON-BLD 11X100MML (ENDOMECHANICALS) ×3 IMPLANT
TROCAR XCEL NON-BLD 5MMX100MML (ENDOMECHANICALS) ×3 IMPLANT
WATER STERILE IRR 1000ML POUR (IV SOLUTION) ×3 IMPLANT

## 2019-01-04 NOTE — Interval H&P Note (Signed)
History and Physical Interval Note:  01/04/2019 9:02 AM  Kathy Blackwell  has presented today for surgery, with the diagnosis of Gallstones, Elevated liver tests  The various methods of treatment have been discussed with the patient and family. After consideration of risks, benefits and other options for treatment, the patient has consented to  Procedure(s): LAPAROSCOPIC CHOLECYSTECTOMY WITH POSSIBLE INTRAOPERATIVE CHOLANGIOGRAM (N/A) TRU CUT LIVER BIOPSY (N/A) as a surgical intervention .  The patient's history has been reviewed, patient examined, no change in status, stable for surgery.  I have reviewed the patient's chart and labs.  Questions were answered to the patient's satisfaction.     Stark Klein

## 2019-01-04 NOTE — Anesthesia Postprocedure Evaluation (Signed)
Anesthesia Post Note  Patient: Hoyt Koch  Procedure(s) Performed: LAPAROSCOPIC CHOLECYSTECTOMY (N/A ) TRU CUT LIVER BIOPSY (N/A )     Patient location during evaluation: PACU Anesthesia Type: General Level of consciousness: awake and alert, awake and oriented Pain management: pain level controlled Vital Signs Assessment: post-procedure vital signs reviewed and stable Respiratory status: spontaneous breathing, nonlabored ventilation and respiratory function stable Cardiovascular status: blood pressure returned to baseline and stable Postop Assessment: no apparent nausea or vomiting Anesthetic complications: no    Last Vitals:  Vitals:   01/04/19 1115 01/04/19 1123  BP: 128/86 124/87  Pulse: 96   Resp: 19 15  Temp:    SpO2: 100% 100%    Last Pain:  Vitals:   01/04/19 1123  TempSrc:   PainSc: 0-No pain                 Catalina Gravel

## 2019-01-04 NOTE — Anesthesia Procedure Notes (Signed)
Procedure Name: Intubation Date/Time: 01/04/2019 9:12 AM Performed by: Lowella Dell, CRNA Pre-anesthesia Checklist: Patient identified, Emergency Drugs available, Suction available and Patient being monitored Patient Re-evaluated:Patient Re-evaluated prior to induction Oxygen Delivery Method: Circle System Utilized Preoxygenation: Pre-oxygenation with 100% oxygen Induction Type: IV induction Ventilation: Mask ventilation without difficulty Laryngoscope Size: Mac and 3 Grade View: Grade I Tube type: Oral Tube size: 7.0 mm Number of attempts: 1 Airway Equipment and Method: Stylet Placement Confirmation: ETT inserted through vocal cords under direct vision,  positive ETCO2 and breath sounds checked- equal and bilateral Secured at: 21 cm Tube secured with: Tape Dental Injury: Teeth and Oropharynx as per pre-operative assessment

## 2019-01-04 NOTE — Transfer of Care (Signed)
Immediate Anesthesia Transfer of Care Note  Patient: Kathy Blackwell  Procedure(s) Performed: LAPAROSCOPIC CHOLECYSTECTOMY (N/A ) TRU CUT LIVER BIOPSY (N/A )  Patient Location: PACU  Anesthesia Type:General  Level of Consciousness: awake, alert  and patient cooperative  Airway & Oxygen Therapy: Patient Spontanous Breathing and Patient connected to nasal cannula oxygen  Post-op Assessment: Report given to RN, Post -op Vital signs reviewed and stable and Patient moving all extremities  Post vital signs: Reviewed and stable  Last Vitals:  Vitals Value Taken Time  BP    Temp    Pulse    Resp 21 01/04/2019 10:38 AM  SpO2    Vitals shown include unvalidated device data.  Last Pain:  Vitals:   01/04/19 0731  TempSrc:   PainSc: 0-No pain      Patients Stated Pain Goal: 6 (69/48/54 6270)  Complications: No apparent anesthesia complications

## 2019-01-04 NOTE — Op Note (Signed)
Liver biopsy, laparoscopic cholecystectomy  Indications: This patient presents with elevated liver function tests, single gallstone, and will undergo laparoscopic cholecystectomy.  Pre-operative Diagnosis: elevated LFTs and gallstone/sludge  Post-operative Diagnosis: Same  Surgeon: Stark Klein   Assistants: Dewaine Oats, RNFA  Anesthesia: General endotracheal anesthesia and local  ASA Class: 1  Procedure Details  The patient was seen again in the Holding Room. The risks, benefits, complications, treatment options, and expected outcomes were discussed with the patient. The possibilities of  bleeding, recurrent infection, damage to nearby structures, the need for additional procedures, failure to diagnose a condition, the possible need to convert to an open procedure, and creating a complication requiring transfusion or operation were discussed with the patient. The likelihood of improving the patient's symptoms with return to their baseline status is good.    The patient and/or family concurred with the proposed plan, giving informed consent. The site of surgery properly noted. The patient was taken to Operating Room, and the procedure verified as Laparoscopic Cholecystectomy with liver biopsy. A Time Out was held and the above information confirmed.  Prior to the induction of general anesthesia, antibiotic prophylaxis was administered. General endotracheal anesthesia was then administered and tolerated well. After the induction, the abdomen was prepped with Chloraprep and draped in the sterile fashion. The patient was positioned in the supine position.  Local anesthetic agent was injected into the skin near the umbilicus and an incision made. We dissected down to the abdominal fascia with blunt dissection.  The fascia was incised vertically and we entered the peritoneal cavity bluntly.  A pursestring suture of 0-Vicryl was placed around the fascial opening.  The Hasson cannula was inserted  and secured with the stay suture.  Pneumoperitoneum was then created with CO2 and tolerated well without any adverse changes in the patient's vital signs. An 11-mm port was placed in the subxiphoid position.  Two 5-mm ports were placed in the right upper quadrant. All skin incisions were infiltrated with a local anesthetic agent before making the incision and placing the trocars.   We positioned the patient in reverse Trendelenburg, tilted slightly to the patient's left. The liver was biopsied with two cores in 3 separate spots in the right liver.  The liver capsule was cauterized and no additional bleeding was seen.    The gallbladder was identified and was very distended.  The Nezhat suction was used to aspirate the gallbladder which had clear bile in it.  At that point, the fundus was grasped and retracted cephalad. Adhesions were lysed bluntly and with the electrocautery where indicated, taking care not to injure any adjacent organs or viscus. The infundibulum was grasped and retracted laterally, exposing the peritoneum overlying the triangle of Calot. This was then divided and exposed in a blunt fashion. A critical view of the cystic duct and cystic artery was obtained.   The cystic duct was then ligated with clips and divided. The cystic artery was identified, dissected free, ligated with clips and divided as well.   The gallbladder was dissected from the liver bed in retrograde fashion with the electrocautery. The gallbladder was removed and placed in an Ecosac.  The gallbladder and Ecosac were then removed through the umbilical port site.  The liver bed was irrigated and inspected. Hemostasis was achieved with the electrocautery. Copious irrigation was utilized and was repeatedly aspirated until clear.    We again inspected the right upper quadrant for hemostasis.  Pneumoperitoneum was released as we removed the trocars.   The pursestring  suture was used to close the umbilical fascia.  4-0 Monocryl  was used to close the skin.   The skin was cleaned and dry, and Dermabond was applied. The patient was then extubated and brought to the recovery room in stable condition. Instrument, sponge, and needle counts were correct at closure and at the conclusion of the case.   Findings: Normal appearing liver.  Distended, inflamed gallbladder with clear bile and moderate size stone lodged in the gallbladder neck.    Estimated Blood Loss: min         Drains: none.          Specimens: Gallbladder to pathology       Complications: None; patient tolerated the procedure well.         Disposition: PACU - hemodynamically stable.         Condition: stable\

## 2019-01-04 NOTE — Discharge Instructions (Signed)
[facility] °Office Phone Number [phone] ° ° POST OP INSTRUCTIONS ° °Always review your discharge instruction sheet given to you by the facility where your surgery was performed. ° °IF YOU HAVE DISABILITY OR FAMILY LEAVE FORMS, YOU MUST BRING THEM TO THE OFFICE FOR PROCESSING.  DO NOT GIVE THEM TO YOUR DOCTOR. ° °1. A prescription for pain medication may be given to you upon discharge.  Take your pain medication as prescribed, if needed.  If narcotic pain medicine is not needed, then you may take acetaminophen (Tylenol) or ibuprofen (Advil) as needed. °2. Take your usually prescribed medications unless otherwise directed °3. If you need a refill on your pain medication, please contact your pharmacy.  They will contact our office to request authorization.  Prescriptions will not be filled after 5pm or on week-ends. °4. You should eat very light the first 24 hours after surgery, such as soup, crackers, pudding, etc.  Resume your normal diet the day after surgery °5. It is common to experience some constipation if taking pain medication after surgery.  Increasing fluid intake and taking a stool softener will usually help or prevent this problem from occurring.  A mild laxative (Milk of Magnesia or Miralax) should be taken according to package directions if there are no bowel movements after 48 hours. °6. You may shower in 48 hours.  The surgical glue will flake off in 2-3 weeks.   °7. ACTIVITIES:  No strenuous activity or heavy lifting for 1 week.   °a. You may drive when you no longer are taking prescription pain medication, you can comfortably wear a seatbelt, and you can safely maneuver your car and apply brakes. °b. RETURN TO WORK:  __________1-2 weeks _______________ °You should see your doctor in the office for a follow-up appointment approximately three-four weeks after your surgery.   ° °WHEN TO CALL YOUR DOCTOR: °1. Fever over 101.0 °2. Nausea and/or vomiting. °3. Extreme swelling or  bruising. °4. Continued bleeding from incision. °5. Increased pain, redness, or drainage from the incision. ° °The clinic staff is available to answer your questions during regular business hours.  Please don’t hesitate to call and ask to speak to one of the nurses for clinical concerns.  If you have a medical emergency, go to the nearest emergency room or call 911.  A surgeon from [facility] is always on call at the hospital. ° °For further questions, please visit [email or web address]  ° °

## 2019-01-05 ENCOUNTER — Encounter (HOSPITAL_COMMUNITY): Payer: Self-pay | Admitting: General Surgery

## 2019-01-09 NOTE — Progress Notes (Signed)
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  That will take around 1 week.

## 2019-02-01 ENCOUNTER — Encounter (HOSPITAL_COMMUNITY): Payer: Self-pay | Admitting: General Surgery

## 2019-02-01 NOTE — Progress Notes (Signed)
Please let patient know copper analysis is normal.  Please also fax copy to PCP and to Roosevelt Locks, NP.

## 2019-03-09 ENCOUNTER — Other Ambulatory Visit (INDEPENDENT_AMBULATORY_CARE_PROVIDER_SITE_OTHER): Payer: 59

## 2019-03-09 DIAGNOSIS — R748 Abnormal levels of other serum enzymes: Secondary | ICD-10-CM

## 2019-03-09 LAB — HEPATIC FUNCTION PANEL
ALT: 82 U/L — AB (ref 0–35)
AST: 34 U/L (ref 0–37)
Albumin: 4.4 g/dL (ref 3.5–5.2)
Alkaline Phosphatase: 74 U/L (ref 39–117)
Bilirubin, Direct: 0.1 mg/dL (ref 0.0–0.3)
Total Bilirubin: 0.6 mg/dL (ref 0.2–1.2)
Total Protein: 6.8 g/dL (ref 6.0–8.3)

## 2019-03-13 DIAGNOSIS — R740 Nonspecific elevation of levels of transaminase and lactic acid dehydrogenase [LDH]: Secondary | ICD-10-CM | POA: Diagnosis not present

## 2019-03-13 DIAGNOSIS — Z8349 Family history of other endocrine, nutritional and metabolic diseases: Secondary | ICD-10-CM | POA: Diagnosis not present

## 2019-03-29 DIAGNOSIS — Z6820 Body mass index (BMI) 20.0-20.9, adult: Secondary | ICD-10-CM | POA: Diagnosis not present

## 2019-03-29 DIAGNOSIS — Z01419 Encounter for gynecological examination (general) (routine) without abnormal findings: Secondary | ICD-10-CM | POA: Diagnosis not present

## 2019-03-29 LAB — RESULTS CONSOLE HPV: CHL HPV: NEGATIVE

## 2019-03-29 LAB — HM PAP SMEAR: HM Pap smear: NORMAL

## 2019-05-16 MED FILL — PREVIDENT 5000 BOOSTER PLUS: 1.1 | 90 days supply | Qty: 300 | Fill #1

## 2019-06-15 ENCOUNTER — Other Ambulatory Visit (INDEPENDENT_AMBULATORY_CARE_PROVIDER_SITE_OTHER): Payer: 59

## 2019-06-15 DIAGNOSIS — R748 Abnormal levels of other serum enzymes: Secondary | ICD-10-CM

## 2019-06-15 LAB — HEPATIC FUNCTION PANEL
ALT: 75 U/L — ABNORMAL HIGH (ref 0–35)
AST: 30 U/L (ref 0–37)
Albumin: 4.3 g/dL (ref 3.5–5.2)
Alkaline Phosphatase: 70 U/L (ref 39–117)
Bilirubin, Direct: 0.1 mg/dL (ref 0.0–0.3)
Total Bilirubin: 0.6 mg/dL (ref 0.2–1.2)
Total Protein: 6.7 g/dL (ref 6.0–8.3)

## 2019-09-21 ENCOUNTER — Telehealth: Payer: Self-pay | Admitting: Nurse Practitioner

## 2019-09-21 NOTE — Telephone Encounter (Signed)
Called pt based on message we received: Appointment Request From: Kathy Blackwell    With Provider: Wilfred Lacy, NP [[facility]]    Preferred Date Range: Any    Preferred Times: Any Time    Reason for visit: Annual Physical    Comments:  Need physical any Wednesday 1130 am slot.    Left message

## 2019-10-03 ENCOUNTER — Telehealth: Payer: Self-pay | Admitting: Nurse Practitioner

## 2019-10-03 NOTE — Telephone Encounter (Signed)

## 2019-10-04 ENCOUNTER — Encounter: Payer: Self-pay | Admitting: Nurse Practitioner

## 2019-10-04 ENCOUNTER — Ambulatory Visit (INDEPENDENT_AMBULATORY_CARE_PROVIDER_SITE_OTHER): Payer: 59 | Admitting: Nurse Practitioner

## 2019-10-04 ENCOUNTER — Other Ambulatory Visit: Payer: Self-pay

## 2019-10-04 VITALS — BP 110/80 | HR 100 | Temp 98.2°F | Ht 62.0 in | Wt 117.8 lb

## 2019-10-04 DIAGNOSIS — Z0000 Encounter for general adult medical examination without abnormal findings: Secondary | ICD-10-CM

## 2019-10-04 DIAGNOSIS — R748 Abnormal levels of other serum enzymes: Secondary | ICD-10-CM | POA: Diagnosis not present

## 2019-10-04 DIAGNOSIS — Z13220 Encounter for screening for lipoid disorders: Secondary | ICD-10-CM | POA: Diagnosis not present

## 2019-10-04 DIAGNOSIS — Z136 Encounter for screening for cardiovascular disorders: Secondary | ICD-10-CM | POA: Diagnosis not present

## 2019-10-04 LAB — COMPREHENSIVE METABOLIC PANEL
ALT: 108 U/L — ABNORMAL HIGH (ref 0–35)
AST: 38 U/L — ABNORMAL HIGH (ref 0–37)
Albumin: 4.5 g/dL (ref 3.5–5.2)
Alkaline Phosphatase: 67 U/L (ref 39–117)
BUN: 11 mg/dL (ref 6–23)
CO2: 26 mEq/L (ref 19–32)
Calcium: 9.6 mg/dL (ref 8.4–10.5)
Chloride: 102 mEq/L (ref 96–112)
Creatinine, Ser: 0.75 mg/dL (ref 0.40–1.20)
GFR: 89.16 mL/min (ref >60.00)
Glucose, Bld: 79 mg/dL (ref 70–99)
Potassium: 4.1 mEq/L (ref 3.5–5.1)
Sodium: 136 mEq/L (ref 135–145)
Total Bilirubin: 0.7 mg/dL (ref 0.2–1.2)
Total Protein: 6.6 g/dL (ref 6.0–8.3)

## 2019-10-04 LAB — LIPID PANEL
Cholesterol: 181 mg/dL (ref 0–200)
HDL: 76.6 mg/dL (ref >39.00)
LDL Cholesterol: 94 mg/dL (ref 0–99)
NonHDL: 104.33
Total CHOL/HDL Ratio: 2
Triglycerides: 51 mg/dL (ref 0.0–149.0)
VLDL: 10.2 mg/dL (ref 0.0–40.0)

## 2019-10-04 LAB — CBC
HCT: 42.8 % (ref 36.0–46.0)
Hemoglobin: 14.3 g/dL (ref 12.0–15.0)
MCHC: 33.4 g/dL (ref 30.0–36.0)
MCV: 90.4 fl (ref 78.0–100.0)
Platelets: 228 10*3/uL (ref 150.0–400.0)
RBC: 4.73 Mil/uL (ref 3.87–5.11)
RDW: 13 % (ref 11.5–15.5)
WBC: 6.7 10*3/uL (ref 4.0–10.5)

## 2019-10-04 LAB — TSH: TSH: 1.88 u[IU]/mL (ref 0.35–4.50)

## 2019-10-04 NOTE — Progress Notes (Signed)
Subjective:    Patient ID: Kathy Blackwell, female    DOB: [birth year], 33 y.o.   MRN: [id number]  Patient presents today for complete physical   HPI Denies any acute complaints.  Sexual History (orientation,birth control, marital status, STD):married, sexually active, same gender partner, up to date with PAP and breast exam (done by GYN)  Depression/Suicide: Depression screen [facility] 2/9 10/04/2019 07/18/2018 12/01/2017  Decreased Interest 0 0 0  Down, Depressed, Hopeless 0 0 0  PHQ - 2 Score 0 0 0   Vision:not needed  Dental:up to date  Immunizations: (TDAP, Hep C screen, Pneumovax, Influenza, zoster)  Health Maintenance  Topic Date Due  . Pap Smear  07/06/2020  . Tetanus Vaccine  10/11/2024  . Flu Shot  Completed  . HIV Screening  Completed   Diet:heart healthy.  Weight:  Wt Readings from Last 3 Encounters:  10/04/19 117 lb 12.8 oz (53.4 kg)  01/04/19 125 lb (56.7 kg)  12/28/18 125 lb 6.4 oz (56.9 kg)    Exercise:walking  Fall Risk: Fall Risk  10/04/2019 12/01/2017  Falls in the past year? 0 No   Advanced Directive: Advanced Directives 12/28/2018  Does Patient Have a Medical Advance Directive? Yes  Type of Paramedic of [place];Living will  Does patient want to make changes to medical advance directive? No - Patient declined  Copy of [place] in Chart? Yes - validated most recent copy scanned in chart (See row information)  Would patient like information on creating a medical advance directive? -     Medications and allergies reviewed with patient and updated if appropriate.  Patient Active Problem List   Diagnosis Date Noted  . Cholestasis during pregnancy 05/10/2017  . Elevated liver enzymes 05/07/2017  . Cholelithiasis 07/19/2015    No current outpatient medications on file prior to visit.   No current facility-administered medications on file prior to visit.     Past Medical History:  Diagnosis Date   . Acute hepatitis 07/2015  . Elevated LFTs   . Finger laceration involving tendon 10/11/2014   right ring and small fingers    Past Surgical History:  Procedure Laterality Date  . CHOLECYSTECTOMY N/A 01/04/2019   Procedure: LAPAROSCOPIC CHOLECYSTECTOMY;  Surgeon: Stark Klein, MD;  Location: [place];  Service: General;  Laterality: N/A;  . DIAGNOSTIC LAPAROSCOPIC LIVER BIOPSY  12/2018   benign pathology  . LIVER BIOPSY N/A 01/04/2019   Procedure: TRU CUT LIVER BIOPSY;  Surgeon: Stark Klein, MD;  Location: [place];  Service: General;  Laterality: N/A;  . TENDON REPAIR Right 10/15/2014   Procedure: RIGHT SMALL TENDON REPAIR;  Surgeon: Leanora Cover, MD;  Location: [place];  Service: Orthopedics;  Laterality: Right;  . TONSILLECTOMY AND ADENOIDECTOMY  1993  . WISDOM TOOTH EXTRACTION    . WOUND EXPLORATION Right 10/15/2014   Procedure: RIGHT RING WOUND EXPLORATION;  Surgeon: Leanora Cover, MD;  Location: [place];  Service: Orthopedics;  Laterality: Right;    Social History   Socioeconomic History  . Marital status: Married    Spouse name: Not on file  . Number of children: 1  . Years of education: Not on file  . Highest education level: Not on file  Occupational History  . Occupation: Physician  Social Needs  . Financial resource strain: Not on file  . Food insecurity    Worry: Not on file    Inability: Not on file  . Transportation needs  Medical: Not on file    Non-medical: Not on file  Tobacco Use  . Smoking status: Never Smoker  . Smokeless tobacco: Never Used  Substance and Sexual Activity  . Alcohol use: Not Currently    Frequency: Never  . Drug use: No  . Sexual activity: Yes    Birth control/protection: None  Lifestyle  . Physical activity    Days per week: Not on file    Minutes per session: Not on file  . Stress: Not on file  Relationships  . Social Herbalist on phone: Not on file    Gets together: Not on file     Attends religious service: Not on file    Active member of club or organization: Not on file    Attends meetings of clubs or organizations: Not on file    Relationship status: Not on file  Other Topics Concern  . Not on file  Social History Narrative  . Not on file    Family History  Problem Relation Age of Onset  . Hypertension Mother   . Hypertension Maternal Grandmother   . Heart disease Maternal Grandmother 75  . Cancer Paternal Uncle        colon cancer thought to be related to work environment Best boy)  . Wilson's disease Brother        with cirrhosis       Review of Systems  Constitutional: Negative for fever, malaise/fatigue and weight loss.  HENT: Negative for congestion and sore throat.   Eyes:       Negative for visual changes  Respiratory: Negative for cough and shortness of breath.   Cardiovascular: Negative for chest pain, palpitations and leg swelling.  Gastrointestinal: Negative for blood in stool, constipation, diarrhea and heartburn.  Genitourinary: Negative for dysuria, frequency and urgency.  Musculoskeletal: Negative for falls, joint pain and myalgias.  Skin: Negative for rash.  Neurological: Negative for dizziness, sensory change and headaches.  Endo/Heme/Allergies: Does not bruise/bleed easily.  Psychiatric/Behavioral: Negative for depression, substance abuse and suicidal ideas. The patient is not nervous/anxious.     Objective:   Vitals:   10/04/19 1134  BP: 110/80  Pulse: 100  Temp: 98.2 F (36.8 C)  SpO2: 98%    Body mass index is 21.55 kg/m.   Physical Examination:  Physical Exam Vitals signs reviewed.  Constitutional:      General: She is not in acute distress.    Appearance: Normal appearance. She is well-developed.  HENT:     Head: Normocephalic.     Right Ear: Tympanic membrane, ear canal and external ear normal.     Left Ear: Tympanic membrane, ear canal and external ear normal.     Nose: Nose normal.  Eyes:      Extraocular Movements: Extraocular movements intact.     Conjunctiva/sclera: Conjunctivae normal.  Neck:     Musculoskeletal: Normal range of motion and neck supple.  Cardiovascular:     Rate and Rhythm: Normal rate and regular rhythm.     Pulses: Normal pulses.     Heart sounds: Normal heart sounds.  Pulmonary:     Effort: Pulmonary effort is normal.     Breath sounds: Normal breath sounds.  Abdominal:     General: Bowel sounds are normal.     Palpations: Abdomen is soft.  Genitourinary:    Comments: Deferred breast and pelvic exam to GYN per patient Musculoskeletal: Normal range of motion.  Lymphadenopathy:     Cervical:  No cervical adenopathy.  Skin:    General: Skin is warm and dry.     Findings: No rash.  Neurological:     Mental Status: She is alert and oriented to person, place, and time.     Deep Tendon Reflexes: Reflexes are normal and symmetric.  Psychiatric:        Mood and Affect: Mood normal.        Behavior: Behavior normal.        Thought Content: Thought content normal.    ASSESSMENT and PLAN:  Tanyiah was seen today for annual exam.  Diagnoses and all orders for this visit:  Preventative health care -     CBC -     Comprehensive metabolic panel -     Lipid panel -     TSH  Encounter for lipid screening for cardiovascular disease -     Lipid panel  Elevated liver enzymes    Cholelithiasis S/p cholecystectomy 12/2018: pathology indicated chronic infection with gallstones  Elevated liver enzymes Stable hepatic panel after cholecystectomy 12/2018 Denies any GI symptoms today 8Lbs weight loss noted today , but this was intentional through healthy diet and exercise. Wt Readings from Last 3 Encounters:  10/04/19 117 lb 12.8 oz (53.4 kg)  01/04/19 125 lb (56.7 kg)  12/28/18 125 lb 6.4 oz (56.9 kg)   Repeat hepatic panel today.       Problem List Items Addressed This Visit      Other   Elevated liver enzymes    Stable hepatic panel  after cholecystectomy 12/2018 Denies any GI symptoms today 8Lbs weight loss noted today , but this was intentional through healthy diet and exercise. Wt Readings from Last 3 Encounters:  10/04/19 117 lb 12.8 oz (53.4 kg)  01/04/19 125 lb (56.7 kg)  12/28/18 125 lb 6.4 oz (56.9 kg)   Repeat hepatic panel today.        Other Visit Diagnoses    Preventative health care    -  Primary   Relevant Orders   CBC   Comprehensive metabolic panel   Lipid panel   TSH   Encounter for lipid screening for cardiovascular disease       Relevant Orders   Lipid panel      Follow up: Return in about 1 year (around 10/03/2020) for CPE (fasting).  Wilfred Lacy, NP

## 2019-10-04 NOTE — Assessment & Plan Note (Signed)
Stable hepatic panel after cholecystectomy 12/2018 Denies any GI symptoms today 8Lbs weight loss noted today , but this was intentional through healthy diet and exercise. Wt Readings from Last 3 Encounters:  10/04/19 117 lb 12.8 oz (53.4 kg)  01/04/19 125 lb (56.7 kg)  12/28/18 125 lb 6.4 oz (56.9 kg)   Repeat hepatic panel today.

## 2019-10-04 NOTE — Assessment & Plan Note (Addendum)
S/p cholecystectomy 12/2018: pathology indicated chronic infection with gallstones

## 2019-10-04 NOTE — Patient Instructions (Addendum)
Go to lab for blood draw.  Health Maintenance, Female Adopting a healthy lifestyle and getting preventive care are important in promoting health and wellness. Ask your health care provider about:  The right schedule for you to have regular tests and exams.  Things you can do on your own to prevent diseases and keep yourself healthy. What should I know about diet, weight, and exercise? Eat a healthy diet   Eat a diet that includes plenty of vegetables, fruits, low-fat dairy products, and lean protein.  Do not eat a lot of foods that are high in solid fats, added sugars, or sodium. Maintain a healthy weight Body mass index (BMI) is used to identify weight problems. It estimates body fat based on height and weight. Your health care provider can help determine your BMI and help you achieve or maintain a healthy weight. Get regular exercise Get regular exercise. This is one of the most important things you can do for your health. Most adults should:  Exercise for at least 150 minutes each week. The exercise should increase your heart rate and make you sweat (moderate-intensity exercise).  Do strengthening exercises at least twice a week. This is in addition to the moderate-intensity exercise.  Spend less time sitting. Even light physical activity can be beneficial. Watch cholesterol and blood lipids Have your blood tested for lipids and cholesterol at 32 years of age, then have this test every 5 years. Have your cholesterol levels checked more often if:  Your lipid or cholesterol levels are high.  You are older than 32 years of age.  You are at high risk for heart disease. What should I know about cancer screening? Depending on your health history and family history, you may need to have cancer screening at various ages. This may include screening for:  Breast cancer.  Cervical cancer.  Colorectal cancer.  Skin cancer.  Lung cancer. What should I know about heart disease,  diabetes, and high blood pressure? Blood pressure and heart disease  High blood pressure causes heart disease and increases the risk of stroke. This is more likely to develop in people who have high blood pressure readings, are of African descent, or are overweight.  Have your blood pressure checked: ? Every 3-5 years if you are 70-71 years of age. ? Every year if you are 39 years old or older. Diabetes Have regular diabetes screenings. This checks your fasting blood sugar level. Have the screening done:  Once every three years after age 100 if you are at a normal weight and have a low risk for diabetes.  More often and at a younger age if you are overweight or have a high risk for diabetes. What should I know about preventing infection? Hepatitis B If you have a higher risk for hepatitis B, you should be screened for this virus. Talk with your health care provider to find out if you are at risk for hepatitis B infection. Hepatitis C Testing is recommended for:  Everyone born from [birth year] through 1964.  Anyone with known risk factors for hepatitis C. Sexually transmitted infections (STIs)  Get screened for STIs, including gonorrhea and chlamydia, if: ? You are sexually active and are younger than 32 years of age. ? You are older than 32 years of age and your health care provider tells you that you are at risk for this type of infection. ? Your sexual activity has changed since you were last screened, and you are at increased risk for chlamydia or  gonorrhea. Ask your health care provider if you are at risk.  Ask your health care provider about whether you are at high risk for HIV. Your health care provider may recommend a prescription medicine to help prevent HIV infection. If you choose to take medicine to prevent HIV, you should first get tested for HIV. You should then be tested every 3 months for as long as you are taking the medicine. Pregnancy  If you are about to stop having your  period (premenopausal) and you may become pregnant, seek counseling before you get pregnant.  Take 400 to 800 micrograms (mcg) of folic acid every day if you become pregnant.  Ask for birth control (contraception) if you want to prevent pregnancy. Osteoporosis and menopause Osteoporosis is a disease in which the bones lose minerals and strength with aging. This can result in bone fractures. If you are 5 years old or older, or if you are at risk for osteoporosis and fractures, ask your health care provider if you should:  Be screened for bone loss.  Take a calcium or vitamin D supplement to lower your risk of fractures.  Be given hormone replacement therapy (HRT) to treat symptoms of menopause. Follow these instructions at home: Lifestyle  Do not use any products that contain nicotine or tobacco, such as cigarettes, e-cigarettes, and chewing tobacco. If you need help quitting, ask your health care provider.  Do not use street drugs.  Do not share needles.  Ask your health care provider for help if you need support or information about quitting drugs. Alcohol use  Do not drink alcohol if: ? Your health care provider tells you not to drink. ? You are pregnant, may be pregnant, or are planning to become pregnant.  If you drink alcohol: ? Limit how much you use to 0-1 drink a day. ? Limit intake if you are breastfeeding.  Be aware of how much alcohol is in your drink. In the [place] one drink equals one 12 oz bottle of beer (355 mL), one 5 oz glass of wine (148 mL), or one 1 oz glass of hard liquor (44 mL). General instructions  Schedule regular health, dental, and eye exams.  Stay current with your vaccines.  Tell your health care provider if: ? You often feel depressed. ? You have ever been abused or do not feel safe at home. Summary  Adopting a healthy lifestyle and getting preventive care are important in promoting health and wellness.  Follow your health care provider's  instructions about healthy diet, exercising, and getting tested or screened for diseases.  Follow your health care provider's instructions on monitoring your cholesterol and blood pressure. This information is not intended to replace advice given to you by your health care provider. Make sure you discuss any questions you have with your health care provider. Document Released: 06/15/2011 Document Revised: 11/23/2018 Document Reviewed: 11/23/2018 Elsevier Patient Education  2020 [employer].

## 2019-10-11 DIAGNOSIS — Z3169 Encounter for other general counseling and advice on procreation: Secondary | ICD-10-CM | POA: Diagnosis not present

## 2019-11-01 DIAGNOSIS — Z3141 Encounter for fertility testing: Secondary | ICD-10-CM | POA: Diagnosis not present

## 2019-11-27 MED FILL — NORETHINDRONE 0.35 MG TABS: 0.35 | 84 days supply | Qty: 84 | Fill #0

## 2019-12-15 NOTE — L&D Delivery Note (Signed)
OB/GYN Faculty Practice Delivery Note  Kathy Blackwell is a 34 y.o. G2P0101 s/p NSVD at [redacted]w[redacted]d.   ROM: 1h 31m with clear fluid GBS Status: negative Maximum Maternal Temperature: 98.7*F  Delivery Date/Time: 07/30/20, 1354 Delivery: Called to room and patient was complete and pushing, head was crowning. Head delivered OA. No nuchal cord present. Shoulder and body delivered in usual fashion. Infant with spontaneous cry, placed on mother's abdomen, dried and stimulated. Cord clamped x 2 after 1-minute delay, and cut by support person under my direct supervision. Cord blood drawn. Placenta delivered spontaneously with gentle cord traction. Fundus firm with massage and Pitocin. Labia, perineum, vagina, and cervix were inspected, 2nd degree laceration was noted.   At this point Dr. Gaetano Net arrived and assumed care of the patient  Merilyn Baba, DO OB/GYN Fellow, Faculty Practice

## 2020-01-03 DIAGNOSIS — N911 Secondary amenorrhea: Secondary | ICD-10-CM | POA: Diagnosis not present

## 2020-01-10 DIAGNOSIS — Z3685 Encounter for antenatal screening for Streptococcus B: Secondary | ICD-10-CM | POA: Diagnosis not present

## 2020-01-10 DIAGNOSIS — Z3481 Encounter for supervision of other normal pregnancy, first trimester: Secondary | ICD-10-CM | POA: Diagnosis not present

## 2020-01-10 LAB — OB RESULTS CONSOLE RUBELLA ANTIBODY, IGM: Rubella: IMMUNE

## 2020-01-10 LAB — OB RESULTS CONSOLE HIV ANTIBODY (ROUTINE TESTING): HIV: NONREACTIVE

## 2020-01-10 LAB — OB RESULTS CONSOLE ABO/RH: RH Type: POSITIVE

## 2020-01-10 LAB — OB RESULTS CONSOLE GC/CHLAMYDIA
Chlamydia: NEGATIVE
Gonorrhea: NEGATIVE

## 2020-01-10 LAB — OB RESULTS CONSOLE RPR: RPR: NONREACTIVE

## 2020-01-10 LAB — OB RESULTS CONSOLE ANTIBODY SCREEN: Antibody Screen: NEGATIVE

## 2020-01-10 LAB — OB RESULTS CONSOLE HEPATITIS B SURFACE ANTIGEN: Hepatitis B Surface Ag: NEGATIVE

## 2020-01-31 ENCOUNTER — Other Ambulatory Visit: Payer: Self-pay

## 2020-01-31 DIAGNOSIS — Z3400 Encounter for supervision of normal first pregnancy, unspecified trimester: Secondary | ICD-10-CM | POA: Diagnosis not present

## 2020-01-31 DIAGNOSIS — Z113 Encounter for screening for infections with a predominantly sexual mode of transmission: Secondary | ICD-10-CM | POA: Diagnosis not present

## 2020-02-07 ENCOUNTER — Other Ambulatory Visit: Payer: Self-pay

## 2020-02-07 DIAGNOSIS — Z36 Encounter for antenatal screening for chromosomal anomalies: Secondary | ICD-10-CM | POA: Diagnosis not present

## 2020-02-07 DIAGNOSIS — Z3682 Encounter for antenatal screening for nuchal translucency: Secondary | ICD-10-CM | POA: Diagnosis not present

## 2020-02-07 DIAGNOSIS — Z3A12 12 weeks gestation of pregnancy: Secondary | ICD-10-CM | POA: Diagnosis not present

## 2020-03-20 DIAGNOSIS — Z361 Encounter for antenatal screening for raised alphafetoprotein level: Secondary | ICD-10-CM | POA: Diagnosis not present

## 2020-03-20 DIAGNOSIS — Z363 Encounter for antenatal screening for malformations: Secondary | ICD-10-CM | POA: Diagnosis not present

## 2020-03-20 DIAGNOSIS — Z3A18 18 weeks gestation of pregnancy: Secondary | ICD-10-CM | POA: Diagnosis not present

## 2020-03-20 DIAGNOSIS — Z3482 Encounter for supervision of other normal pregnancy, second trimester: Secondary | ICD-10-CM | POA: Diagnosis not present

## 2020-03-22 MED FILL — MAKENA 275 MG/1.1ML SOAJ: 275 | 28 days supply | Qty: 4 | Fill #0

## 2020-04-18 MED FILL — MAKENA 275 MG/1.1ML SOAJ: 275 | 28 days supply | Qty: 4 | Fill #1

## 2020-05-15 DIAGNOSIS — Z23 Encounter for immunization: Secondary | ICD-10-CM | POA: Diagnosis not present

## 2020-05-15 DIAGNOSIS — Z3400 Encounter for supervision of normal first pregnancy, unspecified trimester: Secondary | ICD-10-CM | POA: Diagnosis not present

## 2020-05-15 DIAGNOSIS — Z3480 Encounter for supervision of other normal pregnancy, unspecified trimester: Secondary | ICD-10-CM | POA: Diagnosis not present

## 2020-05-29 DIAGNOSIS — L299 Pruritus, unspecified: Secondary | ICD-10-CM | POA: Diagnosis not present

## 2020-05-29 DIAGNOSIS — O99810 Abnormal glucose complicating pregnancy: Secondary | ICD-10-CM | POA: Diagnosis not present

## 2020-06-04 MED FILL — FREESTYLE LITE TEST STRIP: 25 days supply | Qty: 100 | Fill #0

## 2020-06-04 MED FILL — FREESTYLE LITE METER: 1 days supply | Qty: 1 | Fill #0

## 2020-06-04 MED FILL — FREESTYLE LANCETS: 25 days supply | Qty: 100 | Fill #0

## 2020-06-05 DIAGNOSIS — Z3480 Encounter for supervision of other normal pregnancy, unspecified trimester: Secondary | ICD-10-CM | POA: Diagnosis not present

## 2020-06-11 DIAGNOSIS — Z3400 Encounter for supervision of normal first pregnancy, unspecified trimester: Secondary | ICD-10-CM | POA: Diagnosis not present

## 2020-06-11 DIAGNOSIS — Z3A30 30 weeks gestation of pregnancy: Secondary | ICD-10-CM | POA: Diagnosis not present

## 2020-06-11 DIAGNOSIS — O24410 Gestational diabetes mellitus in pregnancy, diet controlled: Secondary | ICD-10-CM | POA: Diagnosis not present

## 2020-06-11 DIAGNOSIS — Z3480 Encounter for supervision of other normal pregnancy, unspecified trimester: Secondary | ICD-10-CM | POA: Diagnosis not present

## 2020-06-19 DIAGNOSIS — Z3A31 31 weeks gestation of pregnancy: Secondary | ICD-10-CM | POA: Diagnosis not present

## 2020-06-19 DIAGNOSIS — Z3483 Encounter for supervision of other normal pregnancy, third trimester: Secondary | ICD-10-CM | POA: Diagnosis not present

## 2020-06-19 DIAGNOSIS — O26619 Liver and biliary tract disorders in pregnancy, unspecified trimester: Secondary | ICD-10-CM | POA: Diagnosis not present

## 2020-06-19 DIAGNOSIS — O24410 Gestational diabetes mellitus in pregnancy, diet controlled: Secondary | ICD-10-CM | POA: Diagnosis not present

## 2020-06-19 DIAGNOSIS — Z3480 Encounter for supervision of other normal pregnancy, unspecified trimester: Secondary | ICD-10-CM | POA: Diagnosis not present

## 2020-06-25 DIAGNOSIS — Z3A32 32 weeks gestation of pregnancy: Secondary | ICD-10-CM | POA: Diagnosis not present

## 2020-06-25 DIAGNOSIS — Z3480 Encounter for supervision of other normal pregnancy, unspecified trimester: Secondary | ICD-10-CM | POA: Diagnosis not present

## 2020-06-25 DIAGNOSIS — Z3400 Encounter for supervision of normal first pregnancy, unspecified trimester: Secondary | ICD-10-CM | POA: Diagnosis not present

## 2020-06-25 DIAGNOSIS — O99891 Other specified diseases and conditions complicating pregnancy: Secondary | ICD-10-CM | POA: Diagnosis not present

## 2020-06-27 MED FILL — FREESTYLE LANCETS: 25 days supply | Qty: 100 | Fill #1

## 2020-06-27 MED FILL — FREESTYLE LITE TEST STRIP: 25 days supply | Qty: 100 | Fill #1

## 2020-07-03 ENCOUNTER — Other Ambulatory Visit: Payer: Self-pay

## 2020-07-03 DIAGNOSIS — Z3A33 33 weeks gestation of pregnancy: Secondary | ICD-10-CM | POA: Diagnosis not present

## 2020-07-03 DIAGNOSIS — O26619 Liver and biliary tract disorders in pregnancy, unspecified trimester: Secondary | ICD-10-CM | POA: Diagnosis not present

## 2020-07-03 DIAGNOSIS — O99891 Other specified diseases and conditions complicating pregnancy: Secondary | ICD-10-CM | POA: Diagnosis not present

## 2020-07-03 DIAGNOSIS — O24410 Gestational diabetes mellitus in pregnancy, diet controlled: Secondary | ICD-10-CM | POA: Diagnosis not present

## 2020-07-03 DIAGNOSIS — Z3480 Encounter for supervision of other normal pregnancy, unspecified trimester: Secondary | ICD-10-CM | POA: Diagnosis not present

## 2020-07-03 DIAGNOSIS — Z3483 Encounter for supervision of other normal pregnancy, third trimester: Secondary | ICD-10-CM | POA: Diagnosis not present

## 2020-07-09 DIAGNOSIS — Z3400 Encounter for supervision of normal first pregnancy, unspecified trimester: Secondary | ICD-10-CM | POA: Diagnosis not present

## 2020-07-09 DIAGNOSIS — O2663 Liver and biliary tract disorders in the puerperium: Secondary | ICD-10-CM | POA: Diagnosis not present

## 2020-07-09 DIAGNOSIS — Z3A34 34 weeks gestation of pregnancy: Secondary | ICD-10-CM | POA: Diagnosis not present

## 2020-07-09 DIAGNOSIS — Z3480 Encounter for supervision of other normal pregnancy, unspecified trimester: Secondary | ICD-10-CM | POA: Diagnosis not present

## 2020-07-09 MED FILL — URSODIOL 300 MG CAPS: 300 | 30 days supply | Qty: 30 | Fill #0

## 2020-07-12 ENCOUNTER — Ambulatory Visit: Payer: 59

## 2020-07-16 ENCOUNTER — Other Ambulatory Visit (HOSPITAL_COMMUNITY): Payer: Self-pay | Admitting: Obstetrics and Gynecology

## 2020-07-16 ENCOUNTER — Ambulatory Visit: Payer: 59 | Admitting: *Deleted

## 2020-07-16 ENCOUNTER — Other Ambulatory Visit: Payer: Self-pay

## 2020-07-16 ENCOUNTER — Ambulatory Visit: Payer: 59 | Attending: Obstetrics and Gynecology

## 2020-07-16 ENCOUNTER — Ambulatory Visit (HOSPITAL_BASED_OUTPATIENT_CLINIC_OR_DEPARTMENT_OTHER): Payer: 59 | Admitting: Obstetrics and Gynecology

## 2020-07-16 DIAGNOSIS — O26613 Liver and biliary tract disorders in pregnancy, third trimester: Secondary | ICD-10-CM | POA: Insufficient documentation

## 2020-07-16 DIAGNOSIS — Z363 Encounter for antenatal screening for malformations: Secondary | ICD-10-CM

## 2020-07-16 DIAGNOSIS — O24410 Gestational diabetes mellitus in pregnancy, diet controlled: Secondary | ICD-10-CM | POA: Diagnosis not present

## 2020-07-16 DIAGNOSIS — K831 Obstruction of bile duct: Secondary | ICD-10-CM | POA: Diagnosis not present

## 2020-07-16 DIAGNOSIS — O26619 Liver and biliary tract disorders in pregnancy, unspecified trimester: Secondary | ICD-10-CM

## 2020-07-16 DIAGNOSIS — O09219 Supervision of pregnancy with history of pre-term labor, unspecified trimester: Secondary | ICD-10-CM | POA: Diagnosis not present

## 2020-07-16 DIAGNOSIS — O09813 Supervision of pregnancy resulting from assisted reproductive technology, third trimester: Secondary | ICD-10-CM | POA: Diagnosis not present

## 2020-07-16 DIAGNOSIS — Z3A35 35 weeks gestation of pregnancy: Secondary | ICD-10-CM

## 2020-07-16 DIAGNOSIS — Z3A00 Weeks of gestation of pregnancy not specified: Secondary | ICD-10-CM | POA: Diagnosis not present

## 2020-07-16 DIAGNOSIS — O26643 Intrahepatic cholestasis of pregnancy, third trimester: Secondary | ICD-10-CM

## 2020-07-16 NOTE — Progress Notes (Signed)
Denies pain, bleeding, leaking. +FM.

## 2020-07-16 NOTE — Progress Notes (Signed)
Maternal-Fetal Medicine   Name: Kathy Blackwell DOB: 06/21/1984 MRN: [id number] Referring Provider: Linda Hedges, DO  I had the pleasure of seeing Dr. Pricilla Holm today at the [facility].  She is here for ultrasound and consultation because of cholestasis of pregnancy. Patient has a diagnosis of cholestasis of pregnancy based on increased serum bile acid levels.  Most recently, her bile acid level was 37 mol/L.  Serum MALT was reported at 250 and AST at 89 IU/L. She also has gestational diabetes that is well controlled on diet. She took Actigall but discontinued now. She reports having itching of legs and hands.  Abnormal liver enzymes predated her first pregnancy and it was first noted in 2016 during fertility treatment.  Patient reports she took oral contraception as part of fertility treatment and increased liver enzymes were noted.  Subsequently, she had liver biopsies in 2017, 2018 and in 2020 and all were reported normal and showed no evidence of fibrosis.  Molecular study showed no evidence of Wilson's disease (normal copper level).  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  Hepatitis panel was negative.  Of note, Clomid administration increase the liver enzymes.  She has gestational diabetes that is well controlled on diet. Her prenatal course was, otherwise, uneventful.  On first-trimester screening, the risks of fetal aneuploidies are not increased.  The nuchal translucency measured 1.4 mm, which is within normal range.  MSAFP screening showed low risk for open neural tube defects.  Recent ultrasound raises suspicion of small abdominal circumference and fetal growth restriction.  Obstetric history significant for PPROM at [redacted] weeks gestation.  She had a spontaneous vaginal delivery at [redacted] weeks gestation of a female infant weighing 1,730 g (3-13) at birth.  Her daughter is in good health.  Her  pregnancy was complicated by increased liver enzymes and bile acids and the diagnosis of cholestasis of pregnancy was made. Patient had cholecystectomy in 2020 (cholelithiasis without cholecystitis).  Past medical history: No history of hypertension or diabetes or any other chronic medical conditions. Past surgical history: Cholecystectomy (2020). Medications: Prenatal vitamins. Patient had discontinued 17-OH progesterone injections at 28 weeks' gestation.  Allergies: No known drug allergies. Social history: Denies tobacco drug or alcohol use.  She is married (same-sex partner) and her partner is in good health.  Patient conceived by artificial insemination.  Same donor as in first pregnancy. Family history: Brother has Wilson's disease.  We performed ultrasound today.  The estimated fetal weight is at the 11th percentile.  Abdominal circumference measurement is at the 18th percentile.  Amniotic fluid is normal and good fetal activity seen.  Cephalic presentation.  Fetal anatomical survey appears normal but limited by advanced gestational age.  Antenatal testing is reassuring. BPP 8/8.  Umbilical artery Doppler showed normal forward diastolic flow. Our concerns include: Intrahepatic cholestasis of pregnancy -Incidence is between 0.3 to 0.5%. -Stillbirth is the most significant adverse outcome that occurs in about 1.2% of these pregnancies (compared with 1-2 out of 1,000 normal pregnancies).  Proposed to pathophysiological mechanisms or fetal arrhythmia or vasospasm of fetal vessels because of increased bile acid levels. -Ursodeoxycholic acid is safe in pregnancy.  About 25% of women experience nausea and vomiting.  Complete relief from pruritus is not seen in some women. -Discussed timing of delivery.  If bile acid levels are less than 40 mol/L, risk of stillbirth remains low.  If, however bile acid levels are greater than 100 mol/L,  risk of stillbirth is significantly increased. -We recommend  weekly antenatal testing.  However, antenatal testing does not always predict fetal compromise in pregnancy is complicated with intrahepatic cholestasis. -It is reasonable to deliver at [redacted] weeks gestation in pregnant women with cholestasis.  Society of maternal-fetal medicine recommends delivery to be considered between 30- and 39-weeks gestation. Delivery at 36 weeks' gestation carries a small increase in the likelihood of respiratory-distress syndrome in the newborn. -Delivery is usually considered at [redacted] weeks gestation if bile acid levels are significantly increased (100 mol/L). -Recurrence rate is up to 90% and subsequent pregnancies. -Women with intrahepatic cholestasis in pregnancy are more prone to develop hepatobiliary disease later in that lives. I counseled the patient on the diagnosis, antenatal testing, and timing of delivery. Patient agreed with my recommendation of delivery at 37 weeks.  Gestational diabetes:  I reassured her that given that diabetes is well-controlled on diet, we would not expect adverse outcomes in this pregnancy.  Suspected fetal growth restriction: We reassured her of normal fetal growth and normal abdominal circumference measurements. However, ultrasound has limitations in accurately-estimating fetal weights. The likelihood of chromosomal anomaly is very low given that she had normal first-trimester screening and normal fetal anatomical survey.  Recommendations: -Continue weekly BPP till delivery (at your office). -Delivery at 37 weeks' gestation.  Thank you for consultation. If you have any questions, please contact me at the [facility]. Consultation including face-to-face counseling: 40 minutes.

## 2020-07-17 ENCOUNTER — Telehealth (HOSPITAL_COMMUNITY): Payer: Self-pay | Admitting: *Deleted

## 2020-07-17 ENCOUNTER — Encounter (HOSPITAL_COMMUNITY): Payer: Self-pay | Admitting: *Deleted

## 2020-07-17 DIAGNOSIS — Z3A35 35 weeks gestation of pregnancy: Secondary | ICD-10-CM | POA: Diagnosis not present

## 2020-07-17 DIAGNOSIS — Z3400 Encounter for supervision of normal first pregnancy, unspecified trimester: Secondary | ICD-10-CM | POA: Diagnosis not present

## 2020-07-17 DIAGNOSIS — O99891 Other specified diseases and conditions complicating pregnancy: Secondary | ICD-10-CM | POA: Diagnosis not present

## 2020-07-17 DIAGNOSIS — Z3685 Encounter for antenatal screening for Streptococcus B: Secondary | ICD-10-CM | POA: Diagnosis not present

## 2020-07-17 LAB — OB RESULTS CONSOLE GBS: GBS: NEGATIVE

## 2020-07-17 NOTE — Telephone Encounter (Signed)
Preadmission screen  

## 2020-07-24 DIAGNOSIS — O26613 Liver and biliary tract disorders in pregnancy, third trimester: Secondary | ICD-10-CM | POA: Diagnosis not present

## 2020-07-24 DIAGNOSIS — Z3483 Encounter for supervision of other normal pregnancy, third trimester: Secondary | ICD-10-CM | POA: Diagnosis not present

## 2020-07-24 DIAGNOSIS — Z3A36 36 weeks gestation of pregnancy: Secondary | ICD-10-CM | POA: Diagnosis not present

## 2020-07-29 ENCOUNTER — Other Ambulatory Visit (HOSPITAL_COMMUNITY): Payer: 59

## 2020-07-29 ENCOUNTER — Other Ambulatory Visit (HOSPITAL_COMMUNITY)
Admission: RE | Admit: 2020-07-29 | Discharge: 2020-07-29 | Disposition: A | Discharge status: Home / Self Care | Payer: 59 | Source: Ambulatory Visit | Attending: Obstetrics and Gynecology | Admitting: Obstetrics and Gynecology

## 2020-07-29 DIAGNOSIS — Z20822 Contact with and (suspected) exposure to covid-19: Secondary | ICD-10-CM | POA: Insufficient documentation

## 2020-07-29 DIAGNOSIS — Z01812 Encounter for preprocedural laboratory examination: Secondary | ICD-10-CM | POA: Insufficient documentation

## 2020-07-29 LAB — SARS CORONAVIRUS 2 (TAT 6-24 HRS): SARS Coronavirus 2: NEGATIVE

## 2020-07-29 NOTE — H&P (Signed)
Kathy Blackwell is a 34 y.o. female presenting for 2 stage IOL. Pregnancy complicated by cholestasis of pregnancy with elevated LFT. BA have varied-latest 07/09/20 = 29.4 and AST/ALT = 89/252. She has a Hx of elevated LFT with multiple benign liver Bx. MFM consult 07/16/20 recommend IOL at 37 weeks. U/S 07/16/20 noted EFW 4# 15 oz @ 11% with AC @ 18% with normal AFI and good fetal activity. BPP 07/24/20 > BPP 8/8, Vtx, AFI 16.0. UA doppler had normal forward diastolic flow. Gestational DM with good diet control. Hx of 32 wk PPROM and precipitous delivery. On Makena injections. OB History    Gravida  2   Para  1   Term      Preterm  1   AB      Living  1     SAB      TAB      Ectopic      Multiple  0   Live Births  1          Past Medical History:  Diagnosis Date  . Acute hepatitis 07/2015  . Cholestasis of pregnancy   . Elevated LFTs   . Finger laceration involving tendon 10/11/2014   right ring and small fingers  . Gestational diabetes    Past Surgical History:  Procedure Laterality Date  . CHOLECYSTECTOMY N/A 01/04/2019   Procedure: LAPAROSCOPIC CHOLECYSTECTOMY;  Surgeon: Stark Klein, MD;  Location: [facility];  Service: General;  Laterality: N/A;  . DIAGNOSTIC LAPAROSCOPIC LIVER BIOPSY  12/2018   benign pathology  . LIVER BIOPSY N/A 01/04/2019   Procedure: TRU CUT LIVER BIOPSY;  Surgeon: Stark Klein, MD;  Location: [facility];  Service: General;  Laterality: N/A;  . TENDON REPAIR Right 10/15/2014   Procedure: RIGHT SMALL TENDON REPAIR;  Surgeon: Leanora Cover, MD;  Location: [facility];  Service: Orthopedics;  Laterality: Right;  . TONSILLECTOMY AND ADENOIDECTOMY  1993  . WISDOM TOOTH EXTRACTION    . WOUND EXPLORATION Right 10/15/2014   Procedure: RIGHT RING WOUND EXPLORATION;  Surgeon: Leanora Cover, MD;  Location: [facility];  Service: Orthopedics;  Laterality: Right;   Family History: family history includes Cancer in her paternal uncle; Heart  disease (age of onset: 10) in her maternal grandmother; Hypertension in her maternal grandmother and mother; Wilson's disease in her brother. Social History:  reports that she has never smoked. She has never used smokeless tobacco. She reports previous alcohol use. She reports that she does not use drugs.     Maternal Diabetes: Yes:  Diabetes Type:  Diet controlled Genetic Screening: Normal Maternal Ultrasounds/Referrals: IUGR Fetal Ultrasounds or other Referrals:  None Maternal Substance Abuse:  No Significant Maternal Medications:  Meds include: Other: ursodiol-discontinued Significant Maternal Lab Results:  Group B Strep negative Other Comments:  None  Review of Systems  Eyes: Negative for visual disturbance.  Gastrointestinal: Negative for abdominal pain.  Neurological: Negative for headaches.   Maternal Medical History:  Fetal activity: Perceived fetal activity is normal.        Last menstrual period 11/12/2019, currently breastfeeding. Maternal Exam:  Abdomen: Fetal presentation: vertex     Physical Exam Cardiovascular:     Rate and Rhythm: Normal rate.  Pulmonary:     Effort: Pulmonary effort is normal.     Cx 1-2/30%/-2  07/24/20  Prenatal labs: ABO, Rh: O/Positive/-- (01/27 0000) Antibody: Negative (01/27 0000) Rubella: Immune (01/27 0000) RPR: Nonreactive (01/27 0000)  HBsAg: Negative (01/27 0000)  HIV: Non-reactive (  01/27 0000)  GBS:   negative 07/17/20  Assessment/Plan: 34 yo G2P1 -cholestatsis of pregnancy with elevated LFT -growth restriction -A1GDM with good control -Hx of precipitous PTD @ 32 wks MFM consult done and agree with delivery @ 37 weeks Admit for 2 stage IOL   Shon Millet II 07/29/2020, 6:24 PM

## 2020-07-30 ENCOUNTER — Inpatient Hospital Stay (HOSPITAL_COMMUNITY)
Admission: AD | Admit: 2020-07-30 | Discharge: 2020-08-01 | DRG: 805 | Disposition: A | Discharge status: Home / Self Care | Payer: 59 | Attending: Obstetrics and Gynecology | Admitting: Obstetrics and Gynecology

## 2020-07-30 ENCOUNTER — Inpatient Hospital Stay (HOSPITAL_COMMUNITY): Payer: 59

## 2020-07-30 ENCOUNTER — Encounter (HOSPITAL_COMMUNITY): Payer: Self-pay | Admitting: Obstetrics and Gynecology

## 2020-07-30 ENCOUNTER — Other Ambulatory Visit: Payer: Self-pay

## 2020-07-30 DIAGNOSIS — K831 Obstruction of bile duct: Secondary | ICD-10-CM | POA: Diagnosis present

## 2020-07-30 DIAGNOSIS — O24410 Gestational diabetes mellitus in pregnancy, diet controlled: Secondary | ICD-10-CM | POA: Diagnosis present

## 2020-07-30 DIAGNOSIS — O365930 Maternal care for other known or suspected poor fetal growth, third trimester, not applicable or unspecified: Secondary | ICD-10-CM | POA: Diagnosis present

## 2020-07-30 DIAGNOSIS — O26619 Liver and biliary tract disorders in pregnancy, unspecified trimester: Secondary | ICD-10-CM | POA: Diagnosis present

## 2020-07-30 DIAGNOSIS — O24419 Gestational diabetes mellitus in pregnancy, unspecified control: Secondary | ICD-10-CM | POA: Diagnosis not present

## 2020-07-30 DIAGNOSIS — Z3A37 37 weeks gestation of pregnancy: Secondary | ICD-10-CM | POA: Diagnosis not present

## 2020-07-30 DIAGNOSIS — Z20822 Contact with and (suspected) exposure to covid-19: Secondary | ICD-10-CM | POA: Diagnosis present

## 2020-07-30 DIAGNOSIS — O26649 Intrahepatic cholestasis of pregnancy, unspecified trimester: Secondary | ICD-10-CM | POA: Diagnosis present

## 2020-07-30 DIAGNOSIS — O2662 Liver and biliary tract disorders in childbirth: Secondary | ICD-10-CM | POA: Diagnosis present

## 2020-07-30 LAB — COMPREHENSIVE METABOLIC PANEL
ALT: 87 U/L — ABNORMAL HIGH (ref 0–44)
AST: 37 U/L (ref 15–41)
Albumin: 2.5 g/dL — ABNORMAL LOW (ref 3.5–5.0)
Alkaline Phosphatase: 161 U/L — ABNORMAL HIGH (ref 38–126)
Anion gap: 9 (ref 5–15)
BUN: 9 mg/dL (ref 6–20)
CO2: 21 mmol/L — ABNORMAL LOW (ref 22–32)
Calcium: 8.8 mg/dL — ABNORMAL LOW (ref 8.9–10.3)
Chloride: 108 mmol/L (ref 98–111)
Creatinine, Ser: 0.71 mg/dL (ref 0.44–1.00)
GFR calc Af Amer: >60 mL/min (ref >60)
GFR calc non Af Amer: >60 mL/min (ref >60)
Glucose, Bld: 138 mg/dL — ABNORMAL HIGH (ref 70–99)
Potassium: 3.4 mmol/L — ABNORMAL LOW (ref 3.5–5.1)
Sodium: 138 mmol/L (ref 135–145)
Total Bilirubin: 0.4 mg/dL (ref 0.3–1.2)
Total Protein: 5.4 g/dL — ABNORMAL LOW (ref 6.5–8.1)

## 2020-07-30 LAB — CBC
HCT: 35.8 % — ABNORMAL LOW (ref 36.0–46.0)
Hemoglobin: 11.7 g/dL — ABNORMAL LOW (ref 12.0–15.0)
MCH: 29.9 pg (ref 26.0–34.0)
MCHC: 32.7 g/dL (ref 30.0–36.0)
MCV: 91.6 fL (ref 80.0–100.0)
Platelets: 200 10*3/uL (ref 150–400)
RBC: 3.91 MIL/uL (ref 3.87–5.11)
RDW: 13.1 % (ref 11.5–15.5)
WBC: 8.1 10*3/uL (ref 4.0–10.5)
nRBC: 0 % (ref 0.0–0.2)

## 2020-07-30 LAB — GLUCOSE, CAPILLARY
Glucose-Capillary: 87 mg/dL (ref 70–99)
Glucose-Capillary: 76 mg/dL (ref 70–99)
Glucose-Capillary: 81 mg/dL (ref 70–99)

## 2020-07-30 LAB — TYPE AND SCREEN
ABO/RH(D): O POS
Antibody Screen: NEGATIVE

## 2020-07-30 MED ORDER — OXYCODONE-ACETAMINOPHEN 5-325 MG PO TABS: 2.0000 | ORAL_TABLET | ORAL | Status: DC | PRN

## 2020-07-30 MED ORDER — SENNOSIDES-DOCUSATE SODIUM 8.6-50 MG PO TABS
2.0000 | ORAL_TABLET | ORAL | Status: DC
  Administered 2020-07-30: 2 via ORAL
  Filled 2020-07-30 (×2): qty 2

## 2020-07-30 MED ORDER — WITCH HAZEL-GLYCERIN EX PADS: 1.0000 "application " | MEDICATED_PAD | CUTANEOUS | Status: DC | PRN

## 2020-07-30 MED ORDER — ACETAMINOPHEN 325 MG PO TABS: 650.0000 mg | ORAL_TABLET | ORAL | Status: DC | PRN

## 2020-07-30 MED ORDER — ONDANSETRON HCL 4 MG/2ML IJ SOLN: 4.0000 mg | Freq: Four times a day (QID) | INTRAMUSCULAR | Status: DC | PRN

## 2020-07-30 MED ORDER — TERBUTALINE SULFATE 1 MG/ML IJ SOLN: 0.2500 mg | Freq: Once | INTRAMUSCULAR | Status: DC | PRN

## 2020-07-30 MED ORDER — SIMETHICONE 80 MG PO CHEW: 80.0000 mg | CHEWABLE_TABLET | ORAL | Status: DC | PRN

## 2020-07-30 MED ORDER — DIPHENHYDRAMINE HCL 25 MG PO CAPS: 25.0000 mg | ORAL_CAPSULE | Freq: Four times a day (QID) | ORAL | Status: DC | PRN

## 2020-07-30 MED ORDER — OXYTOCIN BOLUS FROM INFUSION
333.0000 mL | Freq: Once | INTRAVENOUS | Status: AC
  Administered 2020-07-30: 333 mL via INTRAVENOUS

## 2020-07-30 MED ORDER — FLEET ENEMA 7-19 GM/118ML RE ENEM: 1.0000 | ENEMA | RECTAL | Status: DC | PRN

## 2020-07-30 MED ORDER — HYDROXYZINE HCL 50 MG PO TABS: 50.0000 mg | ORAL_TABLET | Freq: Four times a day (QID) | ORAL | Status: DC | PRN

## 2020-07-30 MED ORDER — LACTATED RINGERS IV SOLN: 500.0000 mL | INTRAVENOUS | Status: DC | PRN

## 2020-07-30 MED ORDER — MISOPROSTOL 25 MCG QUARTER TABLET
25.0000 ug | ORAL_TABLET | ORAL | Status: DC | PRN
  Administered 2020-07-30 (×2): 25 ug via VAGINAL
  Filled 2020-07-30 (×2): qty 1

## 2020-07-30 MED ORDER — COCONUT OIL OIL: 1.0000 "application " | TOPICAL_OIL | Status: DC | PRN

## 2020-07-30 MED ORDER — OXYCODONE HCL 5 MG PO TABS: 5.0000 mg | ORAL_TABLET | ORAL | Status: DC | PRN

## 2020-07-30 MED ORDER — PRENATAL MULTIVITAMIN CH
1.0000 | ORAL_TABLET | Freq: Every day | ORAL | Status: DC
  Administered 2020-07-31 – 2020-08-01 (×2): 1 via ORAL
  Filled 2020-07-30 (×2): qty 1

## 2020-07-30 MED ORDER — SOD CITRATE-CITRIC ACID 500-334 MG/5ML PO SOLN: 30.0000 mL | ORAL | Status: DC | PRN

## 2020-07-30 MED ORDER — TETANUS-DIPHTH-ACELL PERTUSSIS 5-2.5-18.5 LF-MCG/0.5 IM SUSP: 0.5000 mL | Freq: Once | INTRAMUSCULAR | Status: DC

## 2020-07-30 MED ORDER — FENTANYL CITRATE (PF) 100 MCG/2ML IJ SOLN
50.0000 ug | INTRAMUSCULAR | Status: DC | PRN
  Administered 2020-07-30: 100 ug via INTRAVENOUS
  Filled 2020-07-30: qty 2

## 2020-07-30 MED ORDER — OXYCODONE HCL 5 MG PO TABS: 10.0000 mg | ORAL_TABLET | ORAL | Status: DC | PRN

## 2020-07-30 MED ORDER — IBUPROFEN 600 MG PO TABS
600.0000 mg | ORAL_TABLET | Freq: Four times a day (QID) | ORAL | Status: DC
  Administered 2020-07-30 – 2020-08-01 (×7): 600 mg via ORAL
  Filled 2020-07-30 (×7): qty 1

## 2020-07-30 MED ORDER — OXYCODONE-ACETAMINOPHEN 5-325 MG PO TABS: 1.0000 | ORAL_TABLET | ORAL | Status: DC | PRN

## 2020-07-30 MED ORDER — OXYTOCIN-SODIUM CHLORIDE 30-0.9 UT/500ML-% IV SOLN
1.0000 m[IU]/min | INTRAVENOUS | Status: DC
  Administered 2020-07-30: 2 m[IU]/min via INTRAVENOUS
  Filled 2020-07-30: qty 500

## 2020-07-30 MED ORDER — OXYTOCIN-SODIUM CHLORIDE 30-0.9 UT/500ML-% IV SOLN: 2.5000 [IU]/h | INTRAVENOUS | Status: DC

## 2020-07-30 MED ORDER — ONDANSETRON HCL 4 MG/2ML IJ SOLN: 4.0000 mg | INTRAMUSCULAR | Status: DC | PRN

## 2020-07-30 MED ORDER — DIBUCAINE (PERIANAL) 1 % EX OINT: 1.0000 "application " | TOPICAL_OINTMENT | CUTANEOUS | Status: DC | PRN

## 2020-07-30 MED ORDER — BENZOCAINE-MENTHOL 20-0.5 % EX AERO
1.0000 "application " | INHALATION_SPRAY | CUTANEOUS | Status: DC | PRN
  Administered 2020-07-30: 1 via TOPICAL
  Filled 2020-07-30: qty 56

## 2020-07-30 MED ORDER — LIDOCAINE HCL (PF) 1 % IJ SOLN
30.0000 mL | INTRAMUSCULAR | Status: AC | PRN
  Administered 2020-07-30: 30 mL via SUBCUTANEOUS
  Filled 2020-07-30: qty 30

## 2020-07-30 MED ORDER — ZOLPIDEM TARTRATE 5 MG PO TABS: 5.0000 mg | ORAL_TABLET | Freq: Every evening | ORAL | Status: DC | PRN

## 2020-07-30 MED ORDER — LACTATED RINGERS IV SOLN: INTRAVENOUS | Status: DC

## 2020-07-30 MED ORDER — ONDANSETRON HCL 4 MG PO TABS: 4.0000 mg | ORAL_TABLET | ORAL | Status: DC | PRN

## 2020-07-30 NOTE — Plan of Care (Signed)
Nataliya Graig, RN 

## 2020-07-30 NOTE — Progress Notes (Signed)
FHT cat one UCs q2-4 Cx 2-3/80/-2/vtx AROM>clear

## 2020-07-30 NOTE — Progress Notes (Signed)
Today's Vitals   07/30/20 0028 07/30/20 0034 07/30/20 0102 07/30/20 0511  BP:   127/84 133/74  Pulse:   99 86  Resp:  17  16  Temp:  98.7 F (37.1 C)  98.4 F (36.9 C)  TempSrc:  Oral  Oral  Weight:      Height:      PainSc: 0-No pain      Body mass index is 25.9 kg/m. FHT cat one UCs irregular Cytotec about 5:17am  Results for orders placed or performed during the hospital encounter of 07/30/20 (from the past 24 hour(s))  CBC     Status: Abnormal   Collection Time: 07/30/20 12:56 AM  Result Value Ref Range   WBC 8.1 4.0 - 10.5 K/uL   RBC 3.91 3.87 - 5.11 MIL/uL   Hemoglobin 11.7 (L) 12.0 - 15.0 g/dL   HCT 35.8 (L) 36 - 46 %   MCV 91.6 80.0 - 100.0 fL   MCH 29.9 26.0 - 34.0 pg   MCHC 32.7 30.0 - 36.0 g/dL   RDW 13.1 11.5 - 15.5 %   Platelets 200 150 - 400 K/uL   nRBC 0.0 0.0 - 0.2 %  Type and screen [facility]     Status: None   Collection Time: 07/30/20 12:56 AM  Result Value Ref Range   ABO/RH(D) O POS    Antibody Screen NEG    Sample Expiration      08/02/2020,2359 Performed at [facility], [place] [address] [place], [place] [address]   Comprehensive metabolic panel     Status: Abnormal   Collection Time: 07/30/20 12:56 AM  Result Value Ref Range   Sodium 138 135 - 145 mmol/L   Potassium 3.4 (L) 3.5 - 5.1 mmol/L   Chloride 108 98 - 111 mmol/L   CO2 21 (L) 22 - 32 mmol/L   Glucose, Bld 138 (H) 70 - 99 mg/dL   BUN 9 6 - 20 mg/dL   Creatinine, Ser 0.71 0.44 - 1.00 mg/dL   Calcium 8.8 (L) 8.9 - 10.3 mg/dL   Total Protein 5.4 (L) 6.5 - 8.1 g/dL   Albumin 2.5 (L) 3.5 - 5.0 g/dL   AST 37 15 - 41 U/L   ALT 87 (H) 0 - 44 U/L   Alkaline Phosphatase 161 (H) 38 - 126 U/L   Total Bilirubin 0.4 0.3 - 1.2 mg/dL   GFR calc non Af Amer >60 >60 mL/min   GFR calc Af Amer >60 >60 mL/min   Anion gap 9 5 - 15   D/W patient above Check cervix about 2-3 hours

## 2020-07-30 NOTE — Progress Notes (Signed)
Delivery note I was called when patient was 7 cm dilated I responded immediately When I arrived, baby delivered and doing well on mother's chest, placenta delivered and Dr Darene Lamer had given 20 ml of lidocaine 1% into second degree ML laceration. I repaired in standard fashion with 2-0 Vicryl Rapide Cx intact Uterus involuting well Patient stable.

## 2020-07-31 LAB — COMPREHENSIVE METABOLIC PANEL
ALT: 76 U/L — ABNORMAL HIGH (ref 0–44)
AST: 37 U/L (ref 15–41)
Albumin: 1.9 g/dL — ABNORMAL LOW (ref 3.5–5.0)
Alkaline Phosphatase: 123 U/L (ref 38–126)
Anion gap: 5 (ref 5–15)
BUN: 6 mg/dL (ref 6–20)
CO2: 21 mmol/L — ABNORMAL LOW (ref 22–32)
Calcium: 8.2 mg/dL — ABNORMAL LOW (ref 8.9–10.3)
Chloride: 109 mmol/L (ref 98–111)
Creatinine, Ser: 0.64 mg/dL (ref 0.44–1.00)
GFR calc Af Amer: >60 mL/min (ref >60)
GFR calc non Af Amer: >60 mL/min (ref >60)
Glucose, Bld: 80 mg/dL (ref 70–99)
Potassium: 3.7 mmol/L (ref 3.5–5.1)
Sodium: 135 mmol/L (ref 135–145)
Total Bilirubin: 0.4 mg/dL (ref 0.3–1.2)
Total Protein: 4.3 g/dL — ABNORMAL LOW (ref 6.5–8.1)

## 2020-07-31 LAB — CBC
HCT: 32 % — ABNORMAL LOW (ref 36.0–46.0)
Hemoglobin: 10.6 g/dL — ABNORMAL LOW (ref 12.0–15.0)
MCH: 30.5 pg (ref 26.0–34.0)
MCHC: 33.1 g/dL (ref 30.0–36.0)
MCV: 92.2 fL (ref 80.0–100.0)
Platelets: 174 10*3/uL (ref 150–400)
RBC: 3.47 MIL/uL — ABNORMAL LOW (ref 3.87–5.11)
RDW: 13.2 % (ref 11.5–15.5)
WBC: 11.2 10*3/uL — ABNORMAL HIGH (ref 4.0–10.5)
nRBC: 0 % (ref 0.0–0.2)

## 2020-07-31 NOTE — Progress Notes (Addendum)
Pt declined lactation at this time. Pt will let us know if she would like her DEBP.  Lactation aware.

## 2020-07-31 NOTE — Progress Notes (Signed)
Post Partum Day 1 Subjective: no complaints, up ad lib, voiding and tolerating PO  Objective: Blood pressure 129/84, pulse 73, temperature (!) 97.5 F (36.4 C), temperature source Oral, resp. rate 16, height 5\' 2"  (1.575 m), weight 64.2 kg, last menstrual period 11/12/2019, SpO2 100 %, unknown if currently breastfeeding.  Physical Exam:  General: alert, cooperative, appears stated age and no distress Lochia: appropriate Uterine Fundus: firm Incision: healing well DVT Evaluation: No evidence of DVT seen on physical exam.  Recent Labs    07/30/20 0056 07/31/20 0448  HGB 11.7* 10.6*  HCT 35.8* 32.0*    Assessment/Plan: Plan for discharge tomorrow and Breastfeeding  Cholestasis And hx of elevated LFTs.  Improved today and patient states that's baseline for her.  Will have her fu with her PCP re these.   LOS: 1 day   Luz Lex 07/31/2020, 10:22 AM

## 2020-08-01 LAB — SURGICAL PATHOLOGY

## 2020-08-01 LAB — RPR: RPR Ser Ql: NONREACTIVE — AB

## 2020-08-01 NOTE — Discharge Summary (Signed)
Postpartum Discharge Summary  Date of Service August 01, 2020     Patient Name: Kathy Blackwell DOB: [birth year] MRN: [id number]  Date of admission: 07/30/2020 Delivery date:07/30/2020  Delivering provider: Merilyn Baba  Date of discharge: 08/01/2020  Admitting diagnosis: Cholestasis during pregnancy [O26.619, K83.1] Intrauterine pregnancy: [redacted]w[redacted]d    Secondary diagnosis:  Active Problems:   Cholestasis during pregnancy  Additional problems: none    Discharge diagnosis: Term Pregnancy Delivered                                              Post partum procedures:none Augmentation: AROM and Pitocin Complications: None  Hospital course: Induction of Labor With Vaginal Delivery   34y.o. yo G2P1102 at 338w2das admitted to the hospital 07/30/2020 for induction of labor.  Indication for induction: Cholestasis of pregnancy.  Patient had an uncomplicated labor course as follows: Membrane Rupture Time/Date: 1:54 PM ,07/30/2020   Delivery Method:Vaginal, Spontaneous  Episiotomy: None  Lacerations:  2nd degree  Details of delivery can be found in separate delivery note.  Patient had a routine postpartum course. Patient is discharged home 08/01/20.  Newborn Data: Birth date:07/30/2020  Birth time:3:51 PM  Gender:Female  Living status:Living  Apgars:9 ,9  [phone]   Magnesium Sulfate received: No BMZ received: No Rhophylac:No MMR:No T-DaP:Given prenatally Flu: No Transfusion:No  Physical exam  Vitals:   07/31/20 0550 07/31/20 1512 07/31/20 2035 08/01/20 0405  BP: 129/84 115/72 120/73 125/87  Pulse: 73 69 72 69  Resp: '16 16 16 16  ' Temp: (!) 97.5 F (36.4 C) 98.5 F (36.9 C) 98.4 F (36.9 C) 97.7 F (36.5 C)  TempSrc: Oral Oral Oral Oral  SpO2: 100% 100% 100% 100%  Weight:      Height:       General: alert, cooperative and no distress Lochia: appropriate Uterine Fundus: firm Incision: Healing well with no significant drainage DVT Evaluation: No  evidence of DVT seen on physical exam. Labs: Lab Results  Component Value Date   WBC 11.2 (H) 07/31/2020   HGB 10.6 (L) 07/31/2020   HCT 32.0 (L) 07/31/2020   MCV 92.2 07/31/2020   PLT 174 07/31/2020   CMP Latest Ref Rng & Units 07/31/2020  Glucose 70 - 99 mg/dL 80  BUN 6 - 20 mg/dL 6  Creatinine 0.44 - 1.00 mg/dL 0.64  Sodium 135 - 145 mmol/L 135  Potassium 3.5 - 5.1 mmol/L 3.7  Chloride 98 - 111 mmol/L 109  CO2 22 - 32 mmol/L 21(L)  Calcium 8.9 - 10.3 mg/dL 8.2(L)  Total Protein 6.5 - 8.1 g/dL 4.3(L)  Total Bilirubin 0.3 - 1.2 mg/dL 0.4  Alkaline Phos 38 - 126 U/L 123  AST 15 - 41 U/L 37  ALT 0 - 44 U/L 76(H)   EdFlavia Shippercore: Edinburgh Postnatal Depression Scale Screening Tool 08/01/2020  I have been able to laugh and see the funny side of things. 0  I have looked forward with enjoyment to things. 0  I have blamed myself unnecessarily when things went wrong. 0  I have been anxious or worried for no good reason. 0  I have felt scared or panicky for no good reason. 1  Things have been getting on top of me. 0  I have been so unhappy that I have had difficulty sleeping. 0  I have felt sad  or miserable. 0  I have been so unhappy that I have been crying. 0  The thought of harming myself has occurred to me. 0  Edinburgh Postnatal Depression Scale Total 1      After visit meds:  Allergies as of 08/01/2020   No Known Allergies     Medication List    You have not been prescribed any medications.      Discharge home in stable condition Infant Feeding: Breast Infant Disposition:home with mother Discharge instruction: per After Visit Summary and Postpartum booklet. Activity: Advance as tolerated. Pelvic rest for 6 weeks.  Diet: routine diet Anticipated Birth Control: Unsure Postpartum Appointment:6 weeks Additional Postpartum F/U: post partum exam Future Appointments:No future appointments. Follow up Visit:      08/01/2020 Cyril Mourning, MD

## 2020-08-02 ENCOUNTER — Ambulatory Visit: Payer: Self-pay

## 2020-08-02 NOTE — Lactation Note (Signed)
This note was copied from a baby's chart. Lactation Consultation Note  Patient Name: Kathy Blackwell YELYH'T Date: 08/02/2020 Reason for consult: Initial assessment   Mother request to see Lactation to get her pump from [facility]. 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  Mother denies having any questions or concerns about breastfeeding her infant. Infant is 37+2 at 5-15. Mother reports that infant is breastfeeding well and has had weight gain.  Mother reports that she pumped for 8 months with last child.   Mother picked the Medela Max Flow pump. She was given a copy of UMR form for her records.    Maternal Data Has patient been taught Hand Expression?: Yes Does the patient have breastfeeding experience prior to this delivery?: Yes  Feeding Feeding Type: Breast Fed  LATCH Score                   Interventions    Lactation Tools Discussed/Used Initiated by:: Kathy Barters RN,IBCLC Date initiated:: 08/02/20   Consult Status Consult Status: Complete    Darla Lesches 08/02/2020, 11:44 AM

## 2020-09-09 DIAGNOSIS — H0102B Squamous blepharitis left eye, upper and lower eyelids: Secondary | ICD-10-CM | POA: Diagnosis not present

## 2020-09-09 DIAGNOSIS — Z1389 Encounter for screening for other disorder: Secondary | ICD-10-CM | POA: Diagnosis not present

## 2020-09-09 DIAGNOSIS — H5213 Myopia, bilateral: Secondary | ICD-10-CM | POA: Diagnosis not present

## 2020-09-09 DIAGNOSIS — H0102A Squamous blepharitis right eye, upper and lower eyelids: Secondary | ICD-10-CM | POA: Diagnosis not present

## 2020-10-10 ENCOUNTER — Other Ambulatory Visit: Payer: Self-pay

## 2020-10-11 ENCOUNTER — Ambulatory Visit (INDEPENDENT_AMBULATORY_CARE_PROVIDER_SITE_OTHER): Payer: 59 | Admitting: Nurse Practitioner

## 2020-10-11 ENCOUNTER — Encounter: Payer: Self-pay | Admitting: Nurse Practitioner

## 2020-10-11 VITALS — BP 122/72 | HR 85 | Temp 97.1°F | Ht 62.5 in | Wt 117.4 lb

## 2020-10-11 DIAGNOSIS — Z13220 Encounter for screening for lipoid disorders: Secondary | ICD-10-CM | POA: Diagnosis not present

## 2020-10-11 DIAGNOSIS — Z0000 Encounter for general adult medical examination without abnormal findings: Secondary | ICD-10-CM | POA: Diagnosis not present

## 2020-10-11 DIAGNOSIS — R739 Hyperglycemia, unspecified: Secondary | ICD-10-CM | POA: Diagnosis not present

## 2020-10-11 DIAGNOSIS — E7800 Pure hypercholesterolemia, unspecified: Secondary | ICD-10-CM | POA: Diagnosis not present

## 2020-10-11 DIAGNOSIS — R748 Abnormal levels of other serum enzymes: Secondary | ICD-10-CM | POA: Diagnosis not present

## 2020-10-11 DIAGNOSIS — Z136 Encounter for screening for cardiovascular disorders: Secondary | ICD-10-CM | POA: Diagnosis not present

## 2020-10-11 NOTE — Assessment & Plan Note (Addendum)
Repeat hepatic panel every 39months: stable Denies any GI symptoms. No jaundice noted  Wt Readings from Last 3 Encounters:  10/11/20 117 lb 6.4 oz (53.3 kg)  07/30/20 141 lb 9.6 oz (64.2 kg)  10/04/19 117 lb 12.8 oz (53.4 kg)

## 2020-10-11 NOTE — Assessment & Plan Note (Addendum)
Diagnosed with gestational DM, reports fasting glucose 60s-70s, maintain heart healthy diet, no polyuria/polyphagia/weight gain/fatigue  Check hgbA1c today: 4.7

## 2020-10-11 NOTE — Patient Instructions (Addendum)
Go to lab for blood draw  Will repeat lab in 29months (HgbA1c and hepatic panel).  Please sign medical release form to get PAP results from GYN.  Preventive Care 20-33 Years Old, Female Preventive care refers to visits with your health care provider and lifestyle choices that can promote health and wellness. This includes:  A yearly physical exam. This may also be called an annual well check.  Regular dental visits and eye exams.  Immunizations.  Screening for certain conditions.  Healthy lifestyle choices, such as eating a healthy diet, getting regular exercise, not using drugs or products that contain nicotine and tobacco, and limiting alcohol use. What can I expect for my preventive care visit? Physical exam Your health care provider will check your:  Height and weight. This may be used to calculate body mass index (BMI), which tells if you are at a healthy weight.  Heart rate and blood pressure.  Skin for abnormal spots. Counseling Your health care provider may ask you questions about your:  Alcohol, tobacco, and drug use.  Emotional well-being.  Home and relationship well-being.  Sexual activity.  Eating habits.  Work and work Statistician.  Method of birth control.  Menstrual cycle.  Pregnancy history. What immunizations do I need?  Influenza (flu) vaccine  This is recommended every year. Tetanus, diphtheria, and pertussis (Tdap) vaccine  You may need a Td booster every 10 years. Varicella (chickenpox) vaccine  You may need this if you have not been vaccinated. Human papillomavirus (HPV) vaccine  If recommended by your health care provider, you may need three doses over 6 months. Measles, mumps, and rubella (MMR) vaccine  You may need at least one dose of MMR. You may also need a second dose. Meningococcal conjugate (MenACWY) vaccine  One dose is recommended if you are age 59-21 years and a first-year college student living in a residence hall, or  if you have one of several medical conditions. You may also need additional booster doses. Pneumococcal conjugate (PCV13) vaccine  You may need this if you have certain conditions and were not previously vaccinated. Pneumococcal polysaccharide (PPSV23) vaccine  You may need one or two doses if you smoke cigarettes or if you have certain conditions. Hepatitis A vaccine  You may need this if you have certain conditions or if you travel or work in places where you may be exposed to hepatitis A. Hepatitis B vaccine  You may need this if you have certain conditions or if you travel or work in places where you may be exposed to hepatitis B. Haemophilus influenzae type b (Hib) vaccine  You may need this if you have certain conditions. You may receive vaccines as individual doses or as more than one vaccine together in one shot (combination vaccines). Talk with your health care provider about the risks and benefits of combination vaccines. What tests do I need?  Blood tests  Lipid and cholesterol levels. These may be checked every 5 years starting at age 68.  Hepatitis C test.  Hepatitis B test. Screening  Diabetes screening. This is done by checking your blood sugar (glucose) after you have not eaten for a while (fasting).  Sexually transmitted disease (STD) testing.  BRCA-related cancer screening. This may be done if you have a family history of breast, ovarian, tubal, or peritoneal cancers.  Pelvic exam and Pap test. This may be done every 3 years starting at age 20. Starting at age 44, this may be done every 5 years if you have  a Pap test in combination with an HPV test. Talk with your health care provider about your test results, treatment options, and if necessary, the need for more tests. Follow these instructions at home: Eating and drinking   Eat a diet that includes fresh fruits and vegetables, whole grains, lean protein, and low-fat dairy.  Take vitamin and mineral  supplements as recommended by your health care provider.  Do not drink alcohol if: ? Your health care provider tells you not to drink. ? You are pregnant, may be pregnant, or are planning to become pregnant.  If you drink alcohol: ? Limit how much you have to 0-1 drink a day. ? Be aware of how much alcohol is in your drink. In the [place] one drink equals one 12 oz bottle of beer (355 mL), one 5 oz glass of wine (148 mL), or one 1 oz glass of hard liquor (44 mL). Lifestyle  Take daily care of your teeth and gums.  Stay active. Exercise for at least 30 minutes on 5 or more days each week.  Do not use any products that contain nicotine or tobacco, such as cigarettes, e-cigarettes, and chewing tobacco. If you need help quitting, ask your health care provider.  If you are sexually active, practice safe sex. Use a condom or other form of birth control (contraception) in order to prevent pregnancy and STIs (sexually transmitted infections). If you plan to become pregnant, see your health care provider for a preconception visit. What's next?  Visit your health care provider once a year for a well check visit.  Ask your health care provider how often you should have your eyes and teeth checked.  Stay up to date on all vaccines. This information is not intended to replace advice given to you by your health care provider. Make sure you discuss any questions you have with your health care provider. Document Revised: 08/11/2018 Document Reviewed: 08/11/2018 Elsevier Patient Education  2020 [employer].

## 2020-10-11 NOTE — Progress Notes (Signed)
Subjective:    Patient ID: Kathy Blackwell, female    DOB: April 07, 1986, 34 y.o.   MRN: [id number]  Patient presents today for CPE and eval of chronic conditions  HPI Diagnosed with gestational DM, reports fasting glucose 60s-70s, maintain heart healthy diet, no polyuria/polyphagia/weight gain/fatigue  Sexual History (orientation,birth control, marital status, STD):up to date with pelvic and breast exam. Completed by GYN with [facility]  Depression/Suicide: Depression screen [facility] 2/9 10/04/2019 07/18/2018 12/01/2017  Decreased Interest 0 0 0  Down, Depressed, Hopeless 0 0 0  PHQ - 2 Score 0 0 0   Vision:up to date  Dental:up to date  Immunizations: (TDAP, Hep C screen, Pneumovax, Influenza, zoster)  Health Maintenance  Topic Date Due  . Pap Smear  07/06/2020  . Urine Protein Check  11/11/2020*  . Tetanus Vaccine  05/15/2030  . Flu Shot  Completed  . COVID-19 Vaccine  Completed  .  Hepatitis C: One time screening is recommended by Center for Disease Control  (CDC) for  adults born from [birth year] through 1964.   Completed  . HIV Screening  Completed  *Topic was postponed. The date shown is not the original due date.   Diet:heart healthy  Weight:  Wt Readings from Last 3 Encounters:  10/11/20 117 lb 6.4 oz (53.3 kg)  07/30/20 141 lb 9.6 oz (64.2 kg)  10/04/19 117 lb 12.8 oz (53.4 kg)    Advanced Directive: Advanced Directives 07/30/2020  Does Patient Have a Medical Advance Directive? Yes  Type of Paramedic of [place];Living will  Does patient want to make changes to medical advance directive? No - Patient declined  Copy of [place] in Chart? No - copy requested  Would patient like information on creating a medical advance directive? -    Medications and allergies reviewed with patient and updated if appropriate.  Patient Active Problem List   Diagnosis Date Noted  . Pure hypercholesterolemia 10/18/2020  .  Hyperglycemia 10/11/2020  . Cholestasis during pregnancy 05/10/2017  . Elevated liver enzymes 05/07/2017   No current outpatient medications on file prior to visit.   No current facility-administered medications on file prior to visit.    Past Medical History:  Diagnosis Date  . Acute hepatitis 07/2015  . Cholelithiasis 07/19/2015  . Cholestasis of pregnancy   . Elevated LFTs   . Finger laceration involving tendon 10/11/2014   right ring and small fingers  . Gestational diabetes     Past Surgical History:  Procedure Laterality Date  . CHOLECYSTECTOMY N/A 01/04/2019   Procedure: LAPAROSCOPIC CHOLECYSTECTOMY;  Surgeon: Stark Klein, MD;  Location: [place];  Service: General;  Laterality: N/A;  . DIAGNOSTIC LAPAROSCOPIC LIVER BIOPSY  12/2018   benign pathology  . LIVER BIOPSY N/A 01/04/2019   Procedure: TRU CUT LIVER BIOPSY;  Surgeon: Stark Klein, MD;  Location: [place];  Service: General;  Laterality: N/A;  . TENDON REPAIR Right 10/15/2014   Procedure: RIGHT SMALL TENDON REPAIR;  Surgeon: Leanora Cover, MD;  Location: [place];  Service: Orthopedics;  Laterality: Right;  . TONSILLECTOMY AND ADENOIDECTOMY  1993  . WISDOM TOOTH EXTRACTION    . WOUND EXPLORATION Right 10/15/2014   Procedure: RIGHT RING WOUND EXPLORATION;  Surgeon: Leanora Cover, MD;  Location: [place];  Service: Orthopedics;  Laterality: Right;    Social History   Socioeconomic History  . Marital status: Married    Spouse name: Not on file  . Number of children:  2  . Years of education: Not on file  . Highest education level: Not on file  Occupational History  . Occupation: Physician  Tobacco Use  . Smoking status: Never Smoker  . Smokeless tobacco: Never Used  Vaping Use  . Vaping Use: Never used  Substance and Sexual Activity  . Alcohol use: Not Currently  . Drug use: No  . Sexual activity: Yes    Birth control/protection: None  Other Topics Concern  . Not on file  Social  History Narrative  . Not on file   Social Determinants of Health   Financial Resource Strain:   . Difficulty of Paying Living Expenses: Not on file  Food Insecurity:   . Worried About Charity fundraiser in the Last Year: Not on file  . Ran Out of Food in the Last Year: Not on file  Transportation Needs:   . Lack of Transportation (Medical): Not on file  . Lack of Transportation (Non-Medical): Not on file  Physical Activity:   . Days of Exercise per Week: Not on file  . Minutes of Exercise per Session: Not on file  Stress:   . Feeling of Stress : Not on file  Social Connections:   . Frequency of Communication with Friends and Family: Not on file  . Frequency of Social Gatherings with Friends and Family: Not on file  . Attends Religious Services: Not on file  . Active Member of Clubs or Organizations: Not on file  . Attends Archivist Meetings: Not on file  . Marital Status: Not on file    Family History  Problem Relation Age of Onset  . Hypertension Mother   . Hypertension Maternal Grandmother   . Heart disease Maternal Grandmother 2  . Cancer Paternal Uncle        colon cancer thought to be related to work environment Best boy)  . Wilson's disease Brother        with cirrhosis       Review of Systems  Constitutional: Negative for fever, malaise/fatigue and weight loss.  HENT: Negative for congestion and sore throat.   Eyes:       Negative for visual changes  Respiratory: Negative for cough and shortness of breath.   Cardiovascular: Negative for chest pain, palpitations and leg swelling.  Gastrointestinal: Negative for blood in stool, constipation, diarrhea and heartburn.  Genitourinary: Negative for dysuria, frequency and urgency.  Musculoskeletal: Negative for falls, joint pain and myalgias.  Skin: Negative for rash.  Neurological: Negative for dizziness, sensory change and headaches.  Endo/Heme/Allergies: Does not bruise/bleed easily.    Psychiatric/Behavioral: Negative for depression, substance abuse and suicidal ideas. The patient is not nervous/anxious.    Objective:   Vitals:   10/11/20 1304  BP: 122/72  Pulse: 85  Temp: (!) 97.1 F (36.2 C)  SpO2: 99%   Body mass index is 21.13 kg/m.  Physical Examination:  Physical Exam Vitals reviewed.  Constitutional:      Appearance: She is well-developed.  HENT:     Right Ear: Tympanic membrane, ear canal and external ear normal.     Left Ear: Tympanic membrane, ear canal and external ear normal.  Eyes:     Extraocular Movements: Extraocular movements intact.     Conjunctiva/sclera: Conjunctivae normal.  Cardiovascular:     Rate and Rhythm: Normal rate and regular rhythm.     Pulses: Normal pulses.     Heart sounds: Normal heart sounds.  Pulmonary:     Effort:  Pulmonary effort is normal. No respiratory distress.     Breath sounds: Normal breath sounds.  Chest:     Chest wall: No tenderness.  Abdominal:     General: Bowel sounds are normal.     Palpations: Abdomen is soft.  Genitourinary:    Comments: Deferred breast and pelvic exam to GYN Musculoskeletal:        General: Normal range of motion.     Cervical back: Neck supple.  Skin:    General: Skin is warm and dry.  Neurological:     Mental Status: She is alert and oriented to person, place, and time.     Deep Tendon Reflexes: Reflexes are normal and symmetric.  Psychiatric:        Mood and Affect: Mood normal.        Behavior: Behavior normal.        Thought Content: Thought content normal.    ASSESSMENT and PLAN: This visit occurred during the SARS-CoV-2 public health emergency.  Safety protocols were in place, including screening questions prior to the visit, additional usage of staff PPE, and extensive cleaning of exam room while observing appropriate contact time as indicated for disinfecting solutions.   Kathy Blackwell was seen today for annual exam.  Diagnoses and all orders for this  visit:  Preventative health care -     Cancel: Comprehensive metabolic panel -     Cancel: CBC with Differential/Platelet -     Cancel: Lipid panel -     CBC with Differential/Platelet -     Comprehensive metabolic panel -     Lipid panel  Pure hypercholesterolemia -     Cancel: Lipid panel -     Lipid panel -     Lipid panel; Future  Elevated liver enzymes -     Hepatic function panel; Future  Hyperglycemia -     Cancel: Hemoglobin A1c -     Hemoglobin A1c      Problem List Items Addressed This Visit      Other   Elevated liver enzymes    Repeat hepatic panel every 51months: stable Denies any GI symptoms. No jaundice noted  Wt Readings from Last 3 Encounters:  10/11/20 117 lb 6.4 oz (53.3 kg)  07/30/20 141 lb 9.6 oz (64.2 kg)  10/04/19 117 lb 12.8 oz (53.4 kg)        Relevant Orders   Hepatic function panel   Hyperglycemia    Diagnosed with gestational DM, reports fasting glucose 60s-70s, maintain heart healthy diet, no polyuria/polyphagia/weight gain/fatigue  Check hgbA1c today: 4.7      Relevant Orders   Hemoglobin A1c (Completed)   Pure hypercholesterolemia    Elevated TC and LDL with fasting lipid panel. Normal BP, no tobacco use, no known atherosclerosis. She maintains heart healthy diet and daily exercise regimen.  Will repeat in 42months      Relevant Orders   Lipid panel (Completed)   Lipid panel    Other Visit Diagnoses    Preventative health care    -  Primary   Relevant Orders   CBC with Differential/Platelet (Completed)   Comprehensive metabolic panel (Completed)   Lipid panel (Completed)      Follow up: Return in about 1 year (around 10/11/2021) for CPE (fasting).  Wilfred Lacy, NP

## 2020-10-12 LAB — COMPREHENSIVE METABOLIC PANEL
AG Ratio: 2.1 (calc) (ref 1.0–2.5)
ALT: 94 U/L — ABNORMAL HIGH (ref 6–29)
AST: 44 U/L — ABNORMAL HIGH (ref 10–30)
Albumin: 4.6 g/dL (ref 3.6–5.1)
Alkaline phosphatase (APISO): 128 U/L — ABNORMAL HIGH (ref 31–125)
BUN: 12 mg/dL (ref 7–25)
CO2: 22 mmol/L (ref 20–32)
Calcium: 10.6 mg/dL — ABNORMAL HIGH (ref 8.6–10.2)
Chloride: 106 mmol/L (ref 98–110)
Creat: 0.79 mg/dL (ref 0.50–1.10)
Globulin: 2.2 g/dL (calc) (ref 1.9–3.7)
Glucose, Bld: 71 mg/dL (ref 65–99)
Potassium: 4.3 mmol/L (ref 3.5–5.3)
Sodium: 139 mmol/L (ref 135–146)
Total Bilirubin: 0.6 mg/dL (ref 0.2–1.2)
Total Protein: 6.8 g/dL (ref 6.1–8.1)

## 2020-10-12 LAB — CBC WITH DIFFERENTIAL/PLATELET
Absolute Monocytes: 194 cells/uL — ABNORMAL LOW (ref 200–950)
Basophils Absolute: 42 cells/uL (ref 0–200)
Basophils Relative: 1.1 %
Eosinophils Absolute: 49 cells/uL (ref 15–500)
Eosinophils Relative: 1.3 %
HCT: 40.7 % (ref 35.0–45.0)
Hemoglobin: 13.6 g/dL (ref 11.7–15.5)
Lymphs Abs: 1444 cells/uL (ref 850–3900)
MCH: 28.9 pg (ref 27.0–33.0)
MCHC: 33.4 g/dL (ref 32.0–36.0)
MCV: 86.6 fL (ref 80.0–100.0)
MPV: 10.1 fL (ref 7.5–12.5)
Monocytes Relative: 5.1 %
Neutro Abs: 2071 cells/uL (ref 1500–7800)
Neutrophils Relative %: 54.5 %
Platelets: 231 10*3/uL (ref 140–400)
RBC: 4.7 10*6/uL (ref 3.80–5.10)
RDW: 12.5 % (ref 11.0–15.0)
Total Lymphocyte: 38 %
WBC: 3.8 10*3/uL (ref 3.8–10.8)

## 2020-10-12 LAB — HEMOGLOBIN A1C
Hgb A1c MFr Bld: 4.7 % of total Hgb (ref <5.7)
Mean Plasma Glucose: 88 (calc)
eAG (mmol/L): 4.9 (calc)

## 2020-10-12 LAB — LIPID PANEL
Cholesterol: 234 mg/dL — ABNORMAL HIGH (ref <200)
HDL: 104 mg/dL (ref >=50)
LDL Cholesterol (Calc): 118 mg/dL (calc) — ABNORMAL HIGH
Non-HDL Cholesterol (Calc): 130 mg/dL (calc) — ABNORMAL HIGH (ref <130)
Total CHOL/HDL Ratio: 2.3 (calc) (ref <5.0)
Triglycerides: 38 mg/dL (ref <150)

## 2020-10-18 DIAGNOSIS — E7800 Pure hypercholesterolemia, unspecified: Secondary | ICD-10-CM | POA: Insufficient documentation

## 2020-10-18 NOTE — Assessment & Plan Note (Signed)
Elevated TC and LDL with fasting lipid panel. Normal BP, no tobacco use, no known atherosclerosis. She maintains heart healthy diet and daily exercise regimen.  Will repeat in 70months

## 2021-02-12 ENCOUNTER — Other Ambulatory Visit (INDEPENDENT_AMBULATORY_CARE_PROVIDER_SITE_OTHER): Payer: 59

## 2021-02-12 DIAGNOSIS — R748 Abnormal levels of other serum enzymes: Secondary | ICD-10-CM

## 2021-02-12 DIAGNOSIS — E7800 Pure hypercholesterolemia, unspecified: Secondary | ICD-10-CM

## 2021-02-12 LAB — HEPATIC FUNCTION PANEL
ALT: 160 U/L — ABNORMAL HIGH (ref 0–35)
AST: 60 U/L — ABNORMAL HIGH (ref 0–37)
Albumin: 4.5 g/dL (ref 3.5–5.2)
Alkaline Phosphatase: 131 U/L — ABNORMAL HIGH (ref 39–117)
Bilirubin, Direct: 0.1 mg/dL (ref 0.0–0.3)
Total Bilirubin: 0.6 mg/dL (ref 0.2–1.2)
Total Protein: 7.3 g/dL (ref 6.0–8.3)

## 2021-02-12 LAB — LIPID PANEL
Cholesterol: 203 mg/dL — ABNORMAL HIGH (ref 0–200)
HDL: 90.9 mg/dL (ref >39.00)
LDL Cholesterol: 102 mg/dL — ABNORMAL HIGH (ref 0–99)
NonHDL: 111.61
Total CHOL/HDL Ratio: 2
Triglycerides: 49 mg/dL (ref 0.0–149.0)
VLDL: 9.8 mg/dL (ref 0.0–40.0)

## 2021-02-13 ENCOUNTER — Encounter: Payer: Self-pay | Admitting: Nurse Practitioner

## 2021-02-13 DIAGNOSIS — R748 Abnormal levels of other serum enzymes: Secondary | ICD-10-CM

## 2021-03-07 ENCOUNTER — Other Ambulatory Visit (INDEPENDENT_AMBULATORY_CARE_PROVIDER_SITE_OTHER): Payer: 59

## 2021-03-07 DIAGNOSIS — R748 Abnormal levels of other serum enzymes: Secondary | ICD-10-CM | POA: Diagnosis not present

## 2021-03-07 LAB — HEPATIC FUNCTION PANEL
ALT: 94 U/L — ABNORMAL HIGH (ref 0–35)
AST: 34 U/L (ref 0–37)
Albumin: 4.8 g/dL (ref 3.5–5.2)
Alkaline Phosphatase: 109 U/L (ref 39–117)
Bilirubin, Direct: 0.1 mg/dL (ref 0.0–0.3)
Total Bilirubin: 0.7 mg/dL (ref 0.2–1.2)
Total Protein: 7.3 g/dL (ref 6.0–8.3)

## 2021-03-12 DIAGNOSIS — R7401 Elevation of levels of liver transaminase levels: Secondary | ICD-10-CM | POA: Diagnosis not present

## 2021-05-21 ENCOUNTER — Other Ambulatory Visit (HOSPITAL_COMMUNITY): Payer: Self-pay

## 2021-05-21 MED ORDER — CARESTART COVID-19 HOME TEST VI KIT
PACK | 0 refills | Status: DC
  Filled 2021-05-21: qty 4, 4d supply, fill #0

## 2021-06-13 ENCOUNTER — Other Ambulatory Visit (INDEPENDENT_AMBULATORY_CARE_PROVIDER_SITE_OTHER): Payer: 59

## 2021-06-13 DIAGNOSIS — R748 Abnormal levels of other serum enzymes: Secondary | ICD-10-CM

## 2021-06-13 LAB — HEPATIC FUNCTION PANEL
ALT: 137 U/L — ABNORMAL HIGH (ref 0–35)
AST: 44 U/L — ABNORMAL HIGH (ref 0–37)
Albumin: 4.6 g/dL (ref 3.5–5.2)
Alkaline Phosphatase: 73 U/L (ref 39–117)
Bilirubin, Direct: 0.1 mg/dL (ref 0.0–0.3)
Total Bilirubin: 0.6 mg/dL (ref 0.2–1.2)
Total Protein: 7.2 g/dL (ref 6.0–8.3)

## 2021-07-10 ENCOUNTER — Other Ambulatory Visit (HOSPITAL_COMMUNITY): Payer: Self-pay

## 2021-07-10 MED ORDER — CARESTART COVID-19 HOME TEST VI KIT
PACK | 0 refills | Status: DC
  Filled 2021-07-10: qty 4, 4d supply, fill #0

## 2021-10-15 DIAGNOSIS — Z01419 Encounter for gynecological examination (general) (routine) without abnormal findings: Secondary | ICD-10-CM | POA: Diagnosis not present

## 2021-10-15 DIAGNOSIS — Z6822 Body mass index (BMI) 22.0-22.9, adult: Secondary | ICD-10-CM | POA: Diagnosis not present

## 2021-12-24 ENCOUNTER — Ambulatory Visit: Payer: 59 | Admitting: Nurse Practitioner

## 2021-12-24 ENCOUNTER — Encounter: Payer: Self-pay | Admitting: Nurse Practitioner

## 2021-12-24 ENCOUNTER — Other Ambulatory Visit: Payer: Self-pay

## 2021-12-24 VITALS — Temp 97.7°F | Ht 62.5 in | Wt 125.2 lb

## 2021-12-24 DIAGNOSIS — R55 Syncope and collapse: Secondary | ICD-10-CM | POA: Diagnosis not present

## 2021-12-24 DIAGNOSIS — R748 Abnormal levels of other serum enzymes: Secondary | ICD-10-CM | POA: Diagnosis not present

## 2021-12-24 DIAGNOSIS — S93401A Sprain of unspecified ligament of right ankle, initial encounter: Secondary | ICD-10-CM

## 2021-12-24 LAB — CBC
HCT: 42.1 % (ref 36.0–46.0)
Hemoglobin: 14.1 g/dL (ref 12.0–15.0)
MCHC: 33.5 g/dL (ref 30.0–36.0)
MCV: 88.4 fl (ref 78.0–100.0)
Platelets: 241 10*3/uL (ref 150.0–400.0)
RBC: 4.77 Mil/uL (ref 3.87–5.11)
RDW: 13.2 % (ref 11.5–15.5)
WBC: 5 10*3/uL (ref 4.0–10.5)

## 2021-12-24 LAB — BASIC METABOLIC PANEL
BUN: 10 mg/dL (ref 6–23)
CO2: 28 mEq/L (ref 19–32)
Calcium: 9.8 mg/dL (ref 8.4–10.5)
Chloride: 103 mEq/L (ref 96–112)
Creatinine, Ser: 0.8 mg/dL (ref 0.40–1.20)
GFR: 95.8 mL/min (ref >60.00)
Glucose, Bld: 84 mg/dL (ref 70–99)
Potassium: 4.9 mEq/L (ref 3.5–5.1)
Sodium: 136 mEq/L (ref 135–145)

## 2021-12-24 LAB — HEPATIC FUNCTION PANEL
ALT: 66 U/L — ABNORMAL HIGH (ref 0–35)
AST: 24 U/L (ref 0–37)
Albumin: 4.4 g/dL (ref 3.5–5.2)
Alkaline Phosphatase: 50 U/L (ref 39–117)
Bilirubin, Direct: 0.1 mg/dL (ref 0.0–0.3)
Total Bilirubin: 0.6 mg/dL (ref 0.2–1.2)
Total Protein: 6.6 g/dL (ref 6.0–8.3)

## 2021-12-24 NOTE — Progress Notes (Signed)
Subjective:  Patient ID: Kathy Blackwell, female    DOB: 1986-01-21  Age: 36 y.o. MRN: [id number]  CC: Acute Visit (Pt c/o sprained right ankle. Pt states she tripped and twisted her ankle and the pain was so severe she became nauseated and passed out. )  Loss of Consciousness This is a new problem. The current episode started today. The problem occurs rarely. The problem has been resolved. She lost consciousness for a period of less than 1 minute. The symptoms are aggravated by standing (twisted ankle going down stairs, felt intense pain which led to nausea and lightheadedness). Associated symptoms include light-headedness and nausea. Pertinent negatives include no abdominal pain, auditory change, aura, back pain, bladder incontinence, bowel incontinence, chest pain, clumsiness, confusion, diaphoresis, dizziness, fever, focal sensory loss, focal weakness, headaches, malaise/fatigue, palpitations, slurred speech, vertigo, visual change, vomiting or weakness. She has tried nothing for the symptoms. There is no history of arrhythmia, CAD, a clotting disorder, CVA, DM, HTN, seizures, a sudden death in family, TIA or vertigo.  Ankle Injury  The incident occurred 1 to 3 hours ago. The incident occurred at home. The injury mechanism was a twisting injury. The pain is present in the right ankle. The quality of the pain is described as aching. The pain is mild. The pain has been Improving since onset. Pertinent negatives include no inability to bear weight, loss of motion, loss of sensation, muscle weakness, numbness or tingling. She reports no foreign bodies present. The symptoms are aggravated by movement. She has tried nothing for the symptoms.  Reports similar syncopal episode after donating blood several year ago.  Reviewed past Medical, Social and Family history today.  Outpatient Medications Prior to Visit  Medication Sig Dispense Refill   Prenatal Vit-Fe Fumarate-FA (PRENATAL MULTIVITAMIN) TABS  tablet Take 1 tablet by mouth daily at 12 noon.     COVID-19 At Home Antigen Test [facility] COVID-19 HOME TEST) KIT Use as directed within package instructions. (Patient not taking: Reported on 12/24/2021) 4 each 0   norethindrone (MICRONOR) 0.35 MG tablet Take 1 tablet by mouth daily. (Patient not taking: Reported on 12/24/2021)     No facility-administered medications prior to visit.   ROS See HPI  Objective:  Temp 97.7 F (36.5 C) (Temporal)    Ht 5' 2.5" (1.588 m)    Wt 125 lb 3.2 oz (56.8 kg)    LMP 12/10/2021 (Approximate)    SpO2 98%    Breastfeeding No    BMI 22.53 kg/m   Positive orthostatic hypotension ( laying to sitting).  Physical Exam Cardiovascular:     Rate and Rhythm: Normal rate and regular rhythm.     Pulses: Normal pulses.     Heart sounds: Normal heart sounds.  Pulmonary:     Effort: Pulmonary effort is normal.  Musculoskeletal:        General: Tenderness present. No swelling.     Right lower leg: No edema.     Left lower leg: No edema.  Neurological:     Mental Status: She is alert and oriented to person, place, and time.    Assessment & Plan:  This visit occurred during the SARS-CoV-2 public health emergency.  Safety protocols were in place, including screening questions prior to the visit, additional usage of staff PPE, and extensive cleaning of exam room while observing appropriate contact time as indicated for disinfecting solutions.   Aracelli was seen today for acute visit.  Diagnoses and all orders for this visit:  Vasovagal  episode -     Basic metabolic panel -     CBC  Elevated liver enzymes -     Hepatic function panel  Sprain of right ankle, unspecified ligament, initial encounter  Advised about use of RICE on ankle. Advised rest, avoid rapid change in position and adequate oral hydration to prevent recurrent vasovagal symptoms.  Problem List Items Addressed This Visit       Other   Elevated liver enzymes   Relevant Orders    Hepatic function panel   Other Visit Diagnoses     Vasovagal episode    -  Primary   Relevant Orders   Basic metabolic panel   CBC   Sprain of right ankle, unspecified ligament, initial encounter           Follow-up: Return in about 3 months (around 03/24/2022) for CPE (fasting).  Wilfred Lacy, NP

## 2021-12-24 NOTE — Patient Instructions (Addendum)
Go to lab for blood draw  Syncope, Adult Syncope refers to a condition in which a person temporarily loses consciousness. Syncope may also be called fainting or passing out. It is caused by a sudden decrease in blood flow to the brain. This can happen for a variety of reasons. Most causes of syncope are not dangerous. It can be triggered by things such as needle sticks, seeing blood, pain, or intense emotion. However, syncope can also be a sign of a serious medical problem, such as a heart abnormality. Other causes can include dehydration, migraines, or taking medicines that lower blood pressure. Your health care provider may do tests to find the reason why you are having syncope. If you faint, get medical help right away. Call your local emergency services (911 in the [place] Follow these instructions at home: Pay attention to any changes in your symptoms. Take these actions to stay safe and to help relieve your symptoms: Knowing when you may be about to faint Signs that you may be about to faint include: Feeling dizzy, weak, light-headed, or like the room is spinning. Feeling nauseous. Seeing spots or seeing all white or all black in your field of vision. Having cold, clammy skin or feeling warm and sweaty. Hearing ringing in the ears (tinnitus). If you start to feel like you might faint, sit or lie down right away. If sitting, put your head down between your legs. If lying down, raise (elevate) your feet above the level of your heart. Breathe deeply and steadily. Wait until all the symptoms have passed. Have someone stay with you until you feel stable. Medicines Take over-the-counter and prescription medicines only as told by your health care provider. If you are taking blood pressure or heart medicine, get up slowly and take several minutes to sit and then stand. This can reduce dizziness and decrease the risk of syncope. Lifestyle Do not drive, use machinery, or play sports until your  health care provider says it is okay. Do not drink alcohol. Do not use any products that contain nicotine or tobacco. These products include cigarettes, chewing tobacco, and vaping devices, such as e-cigarettes. If you need help quitting, ask your health care provider. Avoid hot tubs and saunas. General instructions Talk with your health care provider about your symptoms. You may need to have testing to understand the cause of your syncope. Drink enough fluid to keep your urine pale yellow. Avoid prolonged standing. If you must stand for a long time, do movements such as: Moving your legs. Crossing your legs. Flexing and stretching your leg muscles. Squatting. Keep all follow-up visits. This is important. Contact a health care provider if: You have episodes of near fainting. Get help right away if: You faint. You hit your head or are injured after fainting. You have any of these symptoms that may indicate trouble with your heart: Fast or irregular heartbeats (palpitations). Unusual pain in your chest, abdomen, or back. Shortness of breath. You have a seizure. You have a severe headache. You are confused. You have vision problems. You have severe weakness or trouble walking. You are bleeding from your mouth or rectum, or you have black or tarry stool. These symptoms may represent a serious problem that is an emergency. Do not wait to see if your symptoms will go away. Get medical help right away. Call your local emergency services (911 in the [place] Do not drive yourself to the hospital. Summary Syncope refers to a condition in which a person temporarily loses  consciousness. Syncope may also be called fainting or passing out. It is caused by a sudden decrease in blood flow to the brain. Signs that you may be about to faint include dizziness, feeling light-headed, feeling nauseous, sudden vision changes, or cold, clammy skin. Even though most causes of syncope are not dangerous, syncope  can be a sign of a serious medical problem. Get help right away if you faint. If you start to feel like you might faint, sit or lie down right away. If sitting, put your head down between your legs. If lying down, raise (elevate) your feet above the level of your heart. This information is not intended to replace advice given to you by your health care provider. Make sure you discuss any questions you have with your health care provider. Document Revised: 04/10/2021 Document Reviewed: 04/10/2021 Elsevier Patient Education  2022 [employer].

## 2022-02-04 ENCOUNTER — Other Ambulatory Visit: Payer: Self-pay

## 2022-02-04 ENCOUNTER — Encounter: Payer: Self-pay | Admitting: Nurse Practitioner

## 2022-02-04 ENCOUNTER — Ambulatory Visit (INDEPENDENT_AMBULATORY_CARE_PROVIDER_SITE_OTHER): Payer: 59 | Admitting: Nurse Practitioner

## 2022-02-04 VITALS — BP 124/80 | HR 98 | Temp 98.7°F | Ht 62.75 in | Wt 124.6 lb

## 2022-02-04 DIAGNOSIS — Z0000 Encounter for general adult medical examination without abnormal findings: Secondary | ICD-10-CM | POA: Diagnosis not present

## 2022-02-04 NOTE — Progress Notes (Signed)
Subjective:    Patient ID: Kathy Blackwell, female    DOB: [birth year], 36 y.o.   MRN: [id number]  Patient presents today for CPE  HPI  Vision:up to date Dental:up to date Diet:heart healthy Exercise:walking daily Weight:  Wt Readings from Last 3 Encounters:  02/04/22 124 lb 9.6 oz (56.5 kg)  12/24/21 125 lb 3.2 oz (56.8 kg)  10/11/20 117 lb 6.4 oz (53.3 kg)    Sexual History (orientation,birth control, marital status, STD):pelvic and breast exam completed by GYN, last PAP done 0/2022: normal per patient  Depression/Suicide: Depression screen [facility] 2/9 02/04/2022 12/24/2021 10/04/2019 07/18/2018 12/01/2017  Decreased Interest 0 0 0 0 0  Down, Depressed, Hopeless 0 0 0 0 0  PHQ - 2 Score 0 0 0 0 0  Altered sleeping - 0 - - -  Tired, decreased energy - 0 - - -  Change in appetite - 0 - - -  Feeling bad or failure about yourself  - 0 - - -  Trouble concentrating - 0 - - -  Moving slowly or fidgety/restless - 0 - - -  Suicidal thoughts - 0 - - -  PHQ-9 Score - 0 - - -   Immunizations: (TDAP, Hep C screen, Pneumovax, Influenza, zoster)  Health Maintenance  Topic Date Due   COVID-19 Vaccine (4 - Booster for Pfizer series) 11/01/2020   Pap Smear  03/28/2022   Tetanus Vaccine  05/15/2030   Flu Shot  Completed   Hepatitis C Screening: USPSTF Recommendation to screen - Ages 18-79 yo.  Completed   HIV Screening  Completed   HPV Vaccine  Aged Out   Fall Risk: Fall Risk  12/24/2021 10/04/2019 12/01/2017  Falls in the past year? 1 0 No  Number falls in past yr: 0 - -  Injury with Fall? 1 - -  Risk for fall due to : History of fall(s) - -  Follow up Falls evaluation completed - -   Medications and allergies reviewed with patient and updated if appropriate.  Patient Active Problem List   Diagnosis Date Noted   Pure hypercholesterolemia 10/18/2020   Hyperglycemia 10/11/2020   Cholestasis during pregnancy 05/10/2017   Elevated liver enzymes 05/07/2017   No current outpatient  medications on file prior to visit.   No current facility-administered medications on file prior to visit.    Past Medical History:  Diagnosis Date   Acute hepatitis 07/2015   Cholelithiasis 07/19/2015   Cholestasis of pregnancy    Elevated LFTs    Finger laceration involving tendon 10/11/2014   right ring and small fingers   Gestational diabetes     Past Surgical History:  Procedure Laterality Date   CHOLECYSTECTOMY N/A 01/04/2019   Procedure: LAPAROSCOPIC CHOLECYSTECTOMY;  Surgeon: Stark Klein, MD;  Location: [place];  Service: General;  Laterality: N/A;   DIAGNOSTIC LAPAROSCOPIC LIVER BIOPSY  12/2018   benign pathology   LIVER BIOPSY N/A 01/04/2019   Procedure: TRU CUT LIVER BIOPSY;  Surgeon: Stark Klein, MD;  Location: [place] [facility];  Service: General;  Laterality: N/A;   TENDON REPAIR Right 10/15/2014   Procedure: RIGHT SMALL TENDON REPAIR;  Surgeon: Leanora Cover, MD;  Location: [place];  Service: Orthopedics;  Laterality: Right;   TONSILLECTOMY AND ADENOIDECTOMY  1993   WISDOM TOOTH EXTRACTION     WOUND EXPLORATION Right 10/15/2014   Procedure: RIGHT RING WOUND EXPLORATION;  Surgeon: Leanora Cover, MD;  Location: [place];  Service: Orthopedics;  Laterality: Right;  Social History   Socioeconomic History   Marital status: Married    Spouse name: Not on file   Number of children: 2   Years of education: Not on file   Highest education level: Not on file  Occupational History   Occupation: Physician  Tobacco Use   Smoking status: Never   Smokeless tobacco: Never  Vaping Use   Vaping Use: Never used  Substance and Sexual Activity   Alcohol use: Not Currently   Drug use: No   Sexual activity: Yes    Birth control/protection: None  Other Topics Concern   Not on file  Social History Narrative   Not on file   Social Determinants of Health   Financial Resource Strain: Not on file  Food Insecurity: Not on file  Transportation Needs: Not on  file  Physical Activity: Not on file  Stress: Not on file  Social Connections: Not on file   Family History  Problem Relation Age of Onset   Hypertension Mother    Hypertension Maternal Grandmother    Heart disease Maternal Grandmother 14   Cancer Paternal Uncle        colon cancer thought to be related to work environment Best boy)   [place] disease Brother        with cirrhosis        Review of Systems  Constitutional:  Negative for fever, malaise/fatigue and weight loss.  HENT:  Negative for congestion and sore throat.   Eyes:        Negative for visual changes  Respiratory:  Negative for cough and shortness of breath.   Cardiovascular:  Negative for chest pain, palpitations and leg swelling.  Gastrointestinal:  Negative for blood in stool, constipation, diarrhea and heartburn.  Genitourinary:  Negative for dysuria, frequency and urgency.  Musculoskeletal:  Negative for falls, joint pain and myalgias.  Skin:  Negative for rash.  Neurological:  Negative for dizziness, sensory change and headaches.  Endo/Heme/Allergies:  Does not bruise/bleed easily.  Psychiatric/Behavioral:  Negative for depression, substance abuse and suicidal ideas. The patient is not nervous/anxious.    Objective:   Vitals:   02/04/22 1339  BP: 124/80  Pulse: 98  Temp: 98.7 F (37.1 C)  SpO2: 100%   Body mass index is 22.25 kg/m.  Physical Examination:  Physical Exam Constitutional:      General: She is not in acute distress. HENT:     Right Ear: Tympanic membrane, ear canal and external ear normal.     Left Ear: Tympanic membrane, ear canal and external ear normal.  Eyes:     General: No scleral icterus.    Extraocular Movements: Extraocular movements intact.     Conjunctiva/sclera: Conjunctivae normal.  Cardiovascular:     Rate and Rhythm: Normal rate and regular rhythm.     Pulses: Normal pulses.     Heart sounds: Normal heart sounds.  Pulmonary:     Effort: Pulmonary  effort is normal. No respiratory distress.     Breath sounds: Normal breath sounds.  Abdominal:     General: Bowel sounds are normal. There is no distension.     Palpations: Abdomen is soft.  Genitourinary:    Comments: Deferred breast and pelvic exam to GYN Musculoskeletal:        General: Normal range of motion.     Cervical back: Normal range of motion and neck supple.  Lymphadenopathy:     Cervical: No cervical adenopathy.  Skin:    General: Skin is  warm and dry.  Neurological:     Mental Status: She is alert and oriented to person, place, and time.  Psychiatric:        Mood and Affect: Mood normal.        Behavior: Behavior normal.        Thought Content: Thought content normal.   ASSESSMENT and PLAN: This visit occurred during the SARS-CoV-2 public health emergency.  Safety protocols were in place, including screening questions prior to the visit, additional usage of staff PPE, and extensive cleaning of exam room while observing appropriate contact time as indicated for disinfecting solutions.   Kathy Blackwell was seen today for annual exam.  Diagnoses and all orders for this visit:  Preventative health care  Maintain appts with liver specialists and GYN. Will obtain PAP smear report. Continue heart healthy diet and exercise    Problem List Items Addressed This Visit   None Visit Diagnoses     Preventative health care    -  Primary       Follow up: Return in about 1 year (around 02/04/2023) for CPE (fasting).  Wilfred Lacy, NP

## 2022-02-04 NOTE — Patient Instructions (Signed)
Maintain appts with liver specialists and GYN.  Preventive Care 35-35 Years Oldears Old, Female Preventive care refers to lifestyle choices and visits with your health care provider that can promote health and wellness. Preventive care visits are also called wellness exams. What can I expect for my preventive care visit? Counseling During your preventive care visit, your health care provider may ask about your: Medical history, including: Past medical problems. Family medical history. Pregnancy history. Current health, including: Menstrual cycle. Method of birth control. Emotional well-being. Home life and relationship well-being. Sexual activity and sexual health. Lifestyle, including: Alcohol, nicotine or tobacco, and drug use. Access to firearms. Diet, exercise, and sleep habits. Work and work Statistician. Sunscreen use. Safety issues such as seatbelt and bike helmet use. Physical exam Your health care provider may check your: Height and weight. These may be used to calculate your BMI (body mass index). BMI is a measurement that tells if you are at a healthy weight. Waist circumference. This measures the distance around your waistline. This measurement also tells if you are at a healthy weight and may help predict your risk of certain diseases, such as type 2 diabetes and high blood pressure. Heart rate and blood pressure. Body temperature. Skin for abnormal spots. What immunizations do I need? Vaccines are usually given at various ages, according to a schedule. Your health care provider will recommend vaccines for you based on your age, medical history, and lifestyle or other factors, such as travel or where you work. What tests do I need? Screening Your health care provider may recommend screening tests for certain conditions. This may include: Pelvic exam and Pap test. Lipid and cholesterol levels. Diabetes screening. This is done by checking your blood sugar (glucose) after you  have not eaten for a while (fasting). Hepatitis B test. Hepatitis C test. HIV (human immunodeficiency virus) test. STI (sexually transmitted infection) testing, if you are at risk. BRCA-related cancer screening. This may be done if you have a family history of breast, ovarian, tubal, or peritoneal cancers. Talk with your health care provider about your test results, treatment options, and if necessary, the need for more tests. Follow these instructions at home: Eating and drinking  Eat a healthy diet that includes fresh fruits and vegetables, whole grains, lean protein, and low-fat dairy products. Take vitamin and mineral supplements as recommended by your health care provider. Do not drink alcohol if: Your health care provider tells you not to drink. You are pregnant, may be pregnant, or are planning to become pregnant. If you drink alcohol: Limit how much you have to 0-1 drink a day. Know how much alcohol is in your drink. In the [place] one drink equals one 12 oz bottle of beer (355 mL), one 5 oz glass of wine (148 mL), or one 1 oz glass of hard liquor (44 mL). Lifestyle Brush your teeth every morning and night with fluoride toothpaste. Floss one time each day. Exercise for at least 30 minutes 5 or more days each week. Do not use any products that contain nicotine or tobacco. These products include cigarettes, chewing tobacco, and vaping devices, such as e-cigarettes. If you need help quitting, ask your health care provider. Do not use drugs. If you are sexually active, practice safe sex. Use a condom or other form of protection to prevent STIs. If you do not wish to become pregnant, use a form of birth control. If you plan to become pregnant, see your health care provider for a prepregnancy visit. Find healthy  ways to manage stress, such as: Meditation, yoga, or listening to music. Journaling. Talking to a trusted person. Spending time with friends and family. Minimize exposure to UV  radiation to reduce your risk of skin cancer. Safety Always wear your seat belt while driving or riding in a vehicle. Do not drive: If you have been drinking alcohol. Do not ride with someone who has been drinking. If you have been using any mind-altering substances or drugs. While texting. When you are tired or distracted. Wear a helmet and other protective equipment during sports activities. If you have firearms in your house, make sure you follow all gun safety procedures. Seek help if you have been physically or sexually abused. What's next? Go to your health care provider once a year for an annual wellness visit. Ask your health care provider how often you should have your eyes and teeth checked. Stay up to date on all vaccines. This information is not intended to replace advice given to you by your health care provider. Make sure you discuss any questions you have with your health care provider. Document Revised: 05/28/2021 Document Reviewed: 05/28/2021 Elsevier Patient Education  [place].

## 2022-02-26 ENCOUNTER — Other Ambulatory Visit (HOSPITAL_COMMUNITY): Payer: Self-pay

## 2022-02-26 DIAGNOSIS — D2272 Melanocytic nevi of left lower limb, including hip: Secondary | ICD-10-CM | POA: Diagnosis not present

## 2022-02-26 DIAGNOSIS — L570 Actinic keratosis: Secondary | ICD-10-CM | POA: Diagnosis not present

## 2022-02-26 DIAGNOSIS — D235 Other benign neoplasm of skin of trunk: Secondary | ICD-10-CM | POA: Diagnosis not present

## 2022-02-26 DIAGNOSIS — D225 Melanocytic nevi of trunk: Secondary | ICD-10-CM | POA: Diagnosis not present

## 2022-02-26 DIAGNOSIS — L308 Other specified dermatitis: Secondary | ICD-10-CM | POA: Diagnosis not present

## 2022-02-26 MED ORDER — TRIAMCINOLONE ACETONIDE 0.1 % EX CREA
TOPICAL_CREAM | CUTANEOUS | 0 refills | Status: DC
  Filled 2022-02-26: qty 80, 30d supply, fill #0

## 2022-02-26 MED ORDER — FLUOROURACIL 5 % EX CREA
TOPICAL_CREAM | CUTANEOUS | 0 refills | Status: DC
  Filled 2022-02-26: qty 40, 30d supply, fill #0

## 2022-03-18 DIAGNOSIS — R7401 Elevation of levels of liver transaminase levels: Secondary | ICD-10-CM | POA: Diagnosis not present

## 2022-09-23 ENCOUNTER — Encounter: Payer: Self-pay | Admitting: Nurse Practitioner

## 2023-02-19 ENCOUNTER — Other Ambulatory Visit: Payer: Self-pay

## 2023-02-19 ENCOUNTER — Encounter: Payer: Self-pay | Admitting: Nurse Practitioner

## 2023-02-19 DIAGNOSIS — Z0000 Encounter for general adult medical examination without abnormal findings: Secondary | ICD-10-CM

## 2023-02-19 NOTE — Addendum Note (Signed)
Addended by: Wilfred Lacy L on: 02/19/2023 11:43 AM   Modules accepted: Orders

## 2023-02-26 ENCOUNTER — Other Ambulatory Visit (INDEPENDENT_AMBULATORY_CARE_PROVIDER_SITE_OTHER): Payer: Commercial Managed Care - PPO

## 2023-02-26 DIAGNOSIS — Z0000 Encounter for general adult medical examination without abnormal findings: Secondary | ICD-10-CM

## 2023-02-26 LAB — COMPREHENSIVE METABOLIC PANEL
ALT: 59 U/L — ABNORMAL HIGH (ref 0–35)
AST: 34 U/L (ref 0–37)
Albumin: 3.9 g/dL (ref 3.5–5.2)
Alkaline Phosphatase: 48 U/L (ref 39–117)
BUN: 11 mg/dL (ref 6–23)
CO2: 23 mEq/L (ref 19–32)
Calcium: 9.1 mg/dL (ref 8.4–10.5)
Chloride: 108 mEq/L (ref 96–112)
Creatinine, Ser: 0.7 mg/dL (ref 0.40–1.20)
GFR: 111.52 mL/min (ref >60.00)
Glucose, Bld: 83 mg/dL (ref 70–99)
Potassium: 4.2 mEq/L (ref 3.5–5.1)
Sodium: 140 mEq/L (ref 135–145)
Total Bilirubin: 0.5 mg/dL (ref 0.2–1.2)
Total Protein: 6.6 g/dL (ref 6.0–8.3)

## 2023-02-26 LAB — LIPID PANEL
Cholesterol: 139 mg/dL (ref 0–200)
HDL: 62.5 mg/dL (ref >39.00)
LDL Cholesterol: 67 mg/dL (ref 0–99)
NonHDL: 76.09
Total CHOL/HDL Ratio: 2
Triglycerides: 46 mg/dL (ref 0.0–149.0)
VLDL: 9.2 mg/dL (ref 0.0–40.0)

## 2023-02-26 LAB — HEMOGLOBIN A1C: Hgb A1c MFr Bld: 4.7 % (ref 4.6–6.5)

## 2023-03-17 DIAGNOSIS — R7401 Elevation of levels of liver transaminase levels: Secondary | ICD-10-CM | POA: Diagnosis not present

## 2023-03-25 ENCOUNTER — Encounter: Payer: Self-pay | Admitting: Nurse Practitioner

## 2023-03-25 ENCOUNTER — Ambulatory Visit (INDEPENDENT_AMBULATORY_CARE_PROVIDER_SITE_OTHER): Payer: 59 | Admitting: Nurse Practitioner

## 2023-03-25 VITALS — BP 118/78 | HR 94 | Temp 98.6°F | Resp 16 | Ht 62.75 in | Wt 119.0 lb

## 2023-03-25 DIAGNOSIS — Z0001 Encounter for general adult medical examination with abnormal findings: Secondary | ICD-10-CM | POA: Diagnosis not present

## 2023-03-25 DIAGNOSIS — R7401 Elevation of levels of liver transaminase levels: Secondary | ICD-10-CM

## 2023-03-25 DIAGNOSIS — Z8632 Personal history of gestational diabetes: Secondary | ICD-10-CM | POA: Diagnosis not present

## 2023-03-25 DIAGNOSIS — E7800 Pure hypercholesterolemia, unspecified: Secondary | ICD-10-CM

## 2023-03-25 NOTE — Progress Notes (Signed)
Complete physical exam  Patient: Kathy Blackwell   DOB: [birth year]   37 y.o. Female  MRN: [id number] Visit Date: 03/25/2023  Subjective:    Chief Complaint  Patient presents with   Annual Exam    Not Fasting    Kathy Blackwell is a 37 y.o. female who presents today for a complete physical exam. She reports consuming a low fat diet.  Walking daily  She generally feels well. She reports sleeping well. She does not have additional problems to discuss today.  Vision:No Dental:Yes STD Screen:No  BP Readings from Last 3 Encounters:  03/25/23 118/78  02/04/22 124/80  10/11/20 122/72   Wt Readings from Last 3 Encounters:  03/25/23 119 lb (54 kg)  02/04/22 124 lb 9.6 oz (56.5 kg)  12/24/21 125 lb 3.2 oz (56.8 kg)   Most recent fall risk assessment:    03/25/2023    1:05 PM  Fall Risk   Falls in the past year? 0  Number falls in past yr: 0  Injury with Fall? 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 2018: Intrahepatic cholestasis with pregnancy, resolved with use of usodiol, MRI-large cholelithiasis without obstruction, repeat liver biopsy- mild portal inflammatory infiltrate. 2020: repeat liver biopsy-negative fibrosis, normal copper at 31, negative wilson disease,  S/p cholecystectomy 2020 Negative viral hepatitis screen Negative kaiser ring per eye exam.  Improved ALT No GI symptoms, no juandice Also followed by Annamarie Major at [facility]. Last appt 03/17/23. F/up in 1year     Latest  Ref Rng & Units 02/26/2023    7:24 AM 12/24/2021    9:08 AM 06/13/2021   11:35 AM  Hepatic Function  Total Protein 6.0 - 8.3 g/dL 6.6  6.6  7.2   Albumin 3.5 - 5.2 g/dL 3.9  4.4  4.6   AST 0 - 37 U/L 34  24  44   ALT 0 - 35 U/L 59  66  137   Alk Phosphatase 39 - 117 U/L 48  50  73   Total Bilirubin 0.2 - 1.2 mg/dL 0.5  0.6  0.6   Bilirubin, Direct 0.0 - 0.3 mg/dL  0.1  0.1      Pure hypercholesterolemia Resolved with lifestyle modification  History of gestational diabetes Repeat hgbA1c at 4.7%: normal   Past Medical History:  Diagnosis Date   Acute hepatitis 07/2015   Cholelithiasis 07/19/2015   Cholestasis of pregnancy    Elevated LFTs    Finger laceration involving tendon 10/11/2014   right ring and small fingers   Gestational diabetes    Past Surgical History:  Procedure Laterality Date   CHOLECYSTECTOMY N/A 01/04/2019   Procedure: LAPAROSCOPIC CHOLECYSTECTOMY;  Surgeon: Almond Lint, MD;  Location: [facility];  Service: General;  Laterality: N/A;   DIAGNOSTIC LAPAROSCOPIC LIVER BIOPSY  12/2018   benign pathology   LIVER BIOPSY N/A 01/04/2019   Procedure: TRU CUT LIVER BIOPSY;  Surgeon: Almond Lint, MD;  Location: [facility];  Service: General;  Laterality: N/A;  TENDON REPAIR Right 10/15/2014   Procedure: RIGHT SMALL TENDON REPAIR;  Surgeon: Betha LoaKevin Kuzma, MD;  Location: [facility];  Service: Orthopedics;  Laterality: Right;   TONSILLECTOMY AND ADENOIDECTOMY  1993   WISDOM TOOTH EXTRACTION     WOUND EXPLORATION Right 10/15/2014   Procedure: RIGHT RING WOUND EXPLORATION;  Surgeon: Betha LoaKevin Kuzma, MD;  Location: [facility];  Service: Orthopedics;  Laterality: Right;   Social History   Socioeconomic History   Marital status: Married    Spouse name: Not on file   Number of children: 2   Years of education: Not on file   Highest education level: Not on file  Occupational History   Occupation: Physician  Tobacco Use   Smoking status: Never   Smokeless  tobacco: Never  Vaping Use   Vaping Use: Never used  Substance and Sexual Activity   Alcohol use: Not Currently   Drug use: No   Sexual activity: Yes    Birth control/protection: None  Other Topics Concern   Not on file  Social History Narrative   Not on file   Social Determinants of Health   Financial Resource Strain: Not on file  Food Insecurity: Not on file  Transportation Needs: Not on file  Physical Activity: Not on file  Stress: Not on file  Social Connections: Not on file  Intimate Partner Violence: Not on file   Family Status  Relation Name Status   Mother  Alive   [place]  Alive   Oneal Groutat Uncle  (Not Specified)   Brother  Alive   Family History  Problem Relation Age of Onset   Hypertension Mother    Hypertension Maternal Grandmother    Heart disease Maternal Grandmother [id number]   Cancer Paternal Uncle        colon cancer thought to be related to work environment Scientist, research (physical sciences)(nuclear [employer])   Wilson's disease Brother        with cirrhosis   No Known Allergies  Patient Care Team: Ivori Storr, Bonna Gainsharlotte Lum, NP as PCP - General (Internal Medicine) [place], [facility]   Medications: Outpatient Medications Prior to Visit  Medication Sig   [DISCONTINUED] fluorouracil (EFUDEX) 5 % cream Apply small amount to skin twice a day for 2 weeks   [DISCONTINUED] triamcinolone cream (KENALOG) 0.1 % Apply small amount to skin twice a day   No facility-administered medications prior to visit.    Review of Systems  Constitutional:  Negative for activity change, appetite change and unexpected weight change.  Respiratory: Negative.    Cardiovascular: Negative.   Gastrointestinal: Negative.   Endocrine: Negative for cold intolerance and heat intolerance.  Genitourinary: Negative.   Musculoskeletal: Negative.   Skin: Negative.   Neurological: Negative.   Hematological: Negative.   Psychiatric/Behavioral:  Negative for behavioral problems, decreased concentration, dysphoric mood,  hallucinations, self-injury, sleep disturbance and suicidal ideas. The patient is not nervous/anxious.     Last metabolic panel Lab Results  Component Value Date   GLUCOSE 83 02/26/2023   NA 140 02/26/2023   K 4.2 02/26/2023   CL 108 02/26/2023   CO2 23 02/26/2023   BUN 11 02/26/2023   CREATININE 0.70 02/26/2023   GFRNONAA >60 07/31/2020   CALCIUM 9.1 02/26/2023   PROT 6.6 02/26/2023   ALBUMIN 3.9 02/26/2023   BILITOT 0.5 02/26/2023   ALKPHOS 48 02/26/2023   AST 34 02/26/2023   ALT 59 (H) 02/26/2023   ANIONGAP 5 07/31/2020   Last lipids Lab Results  Component  Value Date   CHOL 139 02/26/2023   HDL 62.50 02/26/2023   LDLCALC 67 02/26/2023   TRIG 46.0 02/26/2023   CHOLHDL 2 02/26/2023   Last hemoglobin A1c Lab Results  Component Value Date   HGBA1C 4.7 02/26/2023      Objective:  BP 118/78 (BP Location: Left Arm, Patient Position: Sitting, Cuff Size: Normal)   Pulse 94   Temp 98.6 F (37 C) (Temporal)   Resp 16   Ht 5' 2.75" (1.594 m)   Wt 119 lb (54 kg)   LMP 03/10/2023 (Approximate)   SpO2 99%   BMI 21.25 kg/m     Physical Exam Vitals and nursing note reviewed.  Constitutional:      General: She is not in acute distress. HENT:     Right Ear: Tympanic membrane, ear canal and external ear normal.     Left Ear: Tympanic membrane, ear canal and external ear normal.     Nose: Nose normal.  Eyes:     Extraocular Movements: Extraocular movements intact.     Conjunctiva/sclera: Conjunctivae normal.     Pupils: Pupils are equal, round, and reactive to light.  Neck:     Thyroid: No thyroid mass, thyromegaly or thyroid tenderness.  Cardiovascular:     Rate and Rhythm: Normal rate and regular rhythm.     Pulses: Normal pulses.     Heart sounds: Normal heart sounds.  Pulmonary:     Effort: Pulmonary effort is normal.     Breath sounds: Normal breath sounds.  Abdominal:     General: Bowel sounds are normal.     Palpations: Abdomen is soft.   Musculoskeletal:        General: Normal range of motion.     Cervical back: Normal range of motion and neck supple.     Right lower leg: No edema.     Left lower leg: No edema.  Lymphadenopathy:     Cervical: No cervical adenopathy.  Skin:    General: Skin is warm and dry.  Neurological:     Mental Status: She is alert and oriented to person, place, and time.     Cranial Nerves: No cranial nerve deficit.  Psychiatric:        Mood and Affect: Mood normal.        Behavior: Behavior normal.        Thought Content: Thought content normal.      No results found for any visits on 03/25/23.    Assessment & Plan:    Routine Health Maintenance and Physical Exam  Immunization History  Administered Date(s) Administered   Influenza,inj,Quad PF,6+ Mos 08/24/2018   Influenza-Unspecified 08/25/2017, 09/03/2019, 09/06/2020, 09/13/2021, 09/23/2022   PFIZER(Purple Top)SARS-COV-2 Vaccination 12/19/2019, 01/08/2020, 09/06/2020   Tdap 10/11/2014, 05/15/2020    Health Maintenance  Topic Date Due   PAP SMEAR-Modifier  03/28/2022   COVID-19 Vaccine (4 - 2023-24 season) 08/14/2022   INFLUENZA VACCINE  07/15/2023   DTaP/Tdap/Td (3 - Td or Tdap) 05/15/2030   Hepatitis C Screening  Completed   HIV Screening  Completed   HPV VACCINES  Aged Out   Discussed health benefits of physical activity, and encouraged her to engage in regular exercise appropriate for her age and condition.  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 2018: Intrahepatic cholestasis with pregnancy, resolved with use of usodiol, MRI-large cholelithiasis without obstruction, repeat liver biopsy- mild portal  inflammatory infiltrate. 2020: repeat liver biopsy-negative fibrosis, normal copper at 31, negative wilson disease,  S/p cholecystectomy 2020 Negative viral hepatitis screen Negative kaiser ring per eye exam.  Improved ALT No GI symptoms, no juandice Also followed by Annamarie Major at [facility]. Last appt 03/17/23. F/up in 1year     Latest Ref Rng & Units 02/26/2023    7:24 AM 12/24/2021    9:08 AM 06/13/2021   11:35 AM  Hepatic Function  Total Protein 6.0 - 8.3 g/dL 6.6  6.6  7.2   Albumin 3.5 - 5.2 g/dL 3.9  4.4  4.6   AST 0 - 37 U/L 34  24  44   ALT 0 - 35 U/L 59  66  137   Alk Phosphatase 39 - 117 U/L 48  50  73   Total Bilirubin 0.2 - 1.2 mg/dL 0.5  0.6  0.6   Bilirubin, Direct 0.0 - 0.3 mg/dL  0.1  0.1          History of gestational diabetes    Repeat hgbA1c at 4.7%: normal      RESOLVED: Pure hypercholesterolemia    Resolved with lifestyle modification      Other Visit Diagnoses     Encounter for preventative adult health care exam with abnormal findings    -  Primary      Return in about 1 year (around 03/24/2024) for CPE (fasting).     Alysia Penna, NP

## 2023-03-25 NOTE — Assessment & Plan Note (Signed)
Repeat hgbA1c at 4.7%: normal

## 2023-03-25 NOTE — Assessment & Plan Note (Addendum)
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 2018: Intrahepatic cholestasis with pregnancy, resolved with use of usodiol, MRI-large cholelithiasis without obstruction, repeat liver biopsy- mild portal inflammatory infiltrate. 2020: repeat liver biopsy-negative fibrosis, normal copper at 32, negative wilson disease,  S/p cholecystectomy 2020 Negative viral hepatitis screen Negative kaiser ring per eye exam.  Improved ALT No GI symptoms, no juandice Also followed by Annamarie Major at [facility]. Last appt 03/17/23. F/up in 1year     Latest Ref Rng & Units 02/26/2023    7:24 AM 12/24/2021    9:08 AM 06/13/2021   11:35 AM  Hepatic Function  Total Protein 6.0 - 8.3 g/dL 6.6  6.6  7.2   Albumin 3.5 - 5.2 g/dL 3.9  4.4  4.6   AST 0 - 37 U/L 34  24  44   ALT 0 - 35 U/L 59  66  137   Alk Phosphatase 39 - 117 U/L 48  50  73   Total Bilirubin 0.2 - 1.2 mg/dL 0.5  0.6  0.6   Bilirubin, Direct 0.0 - 0.3 mg/dL  0.1  0.1

## 2023-03-25 NOTE — Assessment & Plan Note (Signed)
Resolved with lifestyle modification

## 2023-03-25 NOTE — Patient Instructions (Addendum)

## 2023-04-15 DIAGNOSIS — Z01419 Encounter for gynecological examination (general) (routine) without abnormal findings: Secondary | ICD-10-CM | POA: Diagnosis not present

## 2023-04-15 DIAGNOSIS — Z6821 Body mass index (BMI) 21.0-21.9, adult: Secondary | ICD-10-CM | POA: Diagnosis not present

## 2023-09-14 ENCOUNTER — Other Ambulatory Visit (HOSPITAL_COMMUNITY): Payer: Self-pay

## 2023-09-14 MED ORDER — COMIRNATY 30 MCG/0.3ML IM SUSY
PREFILLED_SYRINGE | INTRAMUSCULAR | 0 refills | Status: AC
  Filled 2023-09-14: qty 0.3, 30d supply, fill #0

## 2023-11-02 LAB — HEPATIC FUNCTION PANEL
ALT: 46 U/L — AB (ref 7–35)
AST: 21 (ref 13–35)
Alkaline Phosphatase: 51 (ref 25–125)
Bilirubin, Total: 0.7

## 2023-11-02 LAB — HEPATITIS B SURFACE ANTIGEN
A1c: 409
HM HIV Screening: NEGATIVE
HM Hepatitis Screen: NEGATIVE
Hepatitis B Surface Ag: NEGATIVE

## 2023-11-02 LAB — COMPREHENSIVE METABOLIC PANEL
Albumin: 4.7 (ref 3.5–5.0)
Globulin: 2.5

## 2023-11-02 LAB — LIPID PANEL
Cholesterol: 205 — AB (ref 0–200)
HDL: 91 — AB (ref 35–70)
LDL Cholesterol: 103
LDl/HDL Ratio: 1.1
Triglycerides: 55 (ref 40–160)

## 2023-11-02 LAB — HM HEPATITIS C SCREENING LAB: HM Hepatitis Screen: NEGATIVE

## 2023-11-02 LAB — HIV ANTIBODY (ROUTINE TESTING W REFLEX): HIV 1&2 Ab, 4th Generation: NEGATIVE

## 2023-11-02 LAB — PROTEIN / CREATININE RATIO, URINE: Creatinine, Urine: 0.8

## 2023-11-02 LAB — MICROALBUMIN, URINE: Microalb, Ur: 3

## 2023-11-02 LAB — HEMOGLOBIN A1C: Hemoglobin A1C: 4.9

## 2023-11-02 LAB — HM HIV SCREENING LAB: HM HIV Screening: NEGATIVE

## 2023-11-30 ENCOUNTER — Encounter: Payer: Self-pay | Admitting: Nurse Practitioner

## 2023-12-01 ENCOUNTER — Encounter: Payer: Self-pay | Admitting: Nurse Practitioner

## 2024-03-22 ENCOUNTER — Other Ambulatory Visit: Payer: Self-pay | Admitting: Nurse Practitioner

## 2024-03-22 DIAGNOSIS — R7401 Elevation of levels of liver transaminase levels: Secondary | ICD-10-CM | POA: Diagnosis not present

## 2024-04-05 ENCOUNTER — Encounter: Payer: Self-pay | Admitting: Nurse Practitioner

## 2024-04-05 ENCOUNTER — Ambulatory Visit: Admitting: Nurse Practitioner

## 2024-04-05 VITALS — BP 128/78 | HR 92 | Temp 98.5°F | Ht 63.5 in | Wt 119.2 lb

## 2024-04-05 DIAGNOSIS — Z0000 Encounter for general adult medical examination without abnormal findings: Secondary | ICD-10-CM | POA: Diagnosis not present

## 2024-04-05 DIAGNOSIS — Z0001 Encounter for general adult medical examination with abnormal findings: Secondary | ICD-10-CM

## 2024-04-05 DIAGNOSIS — Z800 Family history of malignant neoplasm of digestive organs: Secondary | ICD-10-CM | POA: Insufficient documentation

## 2024-04-05 NOTE — Assessment & Plan Note (Signed)
 Start colonoscopy at 41

## 2024-04-05 NOTE — Progress Notes (Signed)
 Complete physical exam  Patient: Kathy Blackwell   DOB: 07/12/1986   38 y.o. Female  MRN: [id number] Visit Date: 04/05/2024  Subjective:    Chief Complaint  Patient presents with   Annual Exam    No concerns to discuss.    Kathy Blackwell is a 38 y.o. female who presents today for a complete physical exam. She reports consuming a low fat and low sodium (mediterranean) diet. Home exercise routine includes walking daily. She generally feels well. She reports sleeping well. She does have additional problems to discuss today.  Vision:No, no change in vision Dental:Yes STD Screen:No  She is scheduled for repeat PAP with GYN.  BP Readings from Last 3 Encounters:  04/05/24 128/78  03/25/23 118/78  02/04/22 124/80   Wt Readings from Last 3 Encounters:  04/05/24 119 lb 3.2 oz (54.1 kg)  03/25/23 119 lb (54 kg)  02/04/22 124 lb 9.6 oz (56.5 kg)   Most recent fall risk assessment:    04/05/2024    1:01 PM  Fall Risk   Falls in the past year? 0  Number falls in past yr: 0  Injury with Fall? 0  Risk for fall due to : No Fall Risks  Follow up Falls evaluation completed   Depression screen:Yes - No Depression Most recent depression screenings:    04/05/2024    1:04 PM 03/25/2023    1:05 PM  PHQ 2/9 Scores  PHQ - 2 Score 0 0   HPI  Family history of colon cancer Start colonoscopy at 87  Past Medical History:  Diagnosis Date   Acute hepatitis 07/2015   Cholelithiasis 07/19/2015   Cholestasis of pregnancy    Elevated LFTs    Finger laceration involving tendon 10/11/2014   right ring and small fingers   Gestational diabetes    Past Surgical History:  Procedure Laterality Date   CHOLECYSTECTOMY N/A 01/04/2019   Procedure: LAPAROSCOPIC CHOLECYSTECTOMY;  Surgeon: Lockie Rima, MD;  Location: [facility] OR;  Service: General;  Laterality: N/A;   DIAGNOSTIC LAPAROSCOPIC LIVER BIOPSY  12/2018   benign pathology   LIVER BIOPSY N/A 01/04/2019   Procedure: TRU CUT LIVER BIOPSY;   Surgeon: Lockie Rima, MD;  Location: [facility] OR;  Service: General;  Laterality: N/A;   TENDON REPAIR Right 10/15/2014   Procedure: RIGHT SMALL TENDON REPAIR;  Surgeon: Brunilda Capra, MD;  Location: [facility];  Service: Orthopedics;  Laterality: Right;   TONSILLECTOMY AND ADENOIDECTOMY  1993   WISDOM TOOTH EXTRACTION     WOUND EXPLORATION Right 10/15/2014   Procedure: RIGHT RING WOUND EXPLORATION;  Surgeon: Brunilda Capra, MD;  Location: [facility];  Service: Orthopedics;  Laterality: Right;   Social History   Socioeconomic History   Marital status: Married    Spouse name: Not on file   Number of children: 2   Years of education: Not on file   Highest education level: Professional school degree (e.g., MD, DDS, DVM, JD)  Occupational History   Occupation: Physician  Tobacco Use   Smoking status: Never   Smokeless tobacco: Never  Vaping Use   Vaping status: Never Used  Substance and Sexual Activity   Alcohol use: Not Currently   Drug use: No   Sexual activity: Yes    Birth control/protection: None  Other Topics Concern   Not on file  Social History Narrative   Not on file   Social Drivers of Health   Financial Resource Strain: Low Risk  (04/04/2024)  Overall Financial Resource Strain (CARDIA)    Difficulty of Paying Living Expenses: Not hard at all  Food Insecurity: No Food Insecurity (04/04/2024)   Hunger Vital Sign    Worried About Running Out of Food in the Last Year: Never true    Ran Out of Food in the Last Year: Never true  Transportation Needs: No Transportation Needs (04/04/2024)   PRAPARE - Administrator, Civil Service (Medical): No    Lack of Transportation (Non-Medical): No  Physical Activity: Sufficiently Active (04/04/2024)   Exercise Vital Sign    Days of Exercise per Week: 7 days    Minutes of Exercise per Session: 40 min  Stress: No Stress Concern Present (04/04/2024)   [employer] of Occupational Health -  Occupational Stress Questionnaire    Feeling of Stress : Not at all  Social Connections: Socially Integrated (04/04/2024)   Social Connection and Isolation Panel [NHANES]    Frequency of Communication with Friends and Family: More than three times a week    Frequency of Social Gatherings with Friends and Family: Twice a week    Attends Religious Services: More than 4 times per year    Active Member of [employer] or Organizations: Yes    Attends Engineer, structural: More than 4 times per year    Marital Status: Married  Catering manager Violence: Not on file   Family Status  Relation Name Status   Mother  Alive   Brother  Alive   Mat Uncle  Deceased   Nutritional therapist  (Not Specified)   MGM  Alive   PGM  Deceased  No partnership data on file   Family History  Problem Relation Age of Onset   Hypertension Mother    Wilson's disease Brother        with cirrhosis   Cancer Maternal Uncle 50       colon cancer, negative genetic test   Hypertension Maternal Grandmother    Heart disease Maternal Grandmother 40   Cancer Paternal Grandmother        colon   No Known Allergies  Patient Care Team: Kedra Mcglade, Connye Delaine, NP as PCP - General (Internal Medicine) Jonette Nestle, [facility]   Medications: Outpatient Medications Prior to Visit  Medication Sig   COVID-19 mRNA vaccine, Pfizer, (COMIRNATY ) syringe Inject into the muscle.   No facility-administered medications prior to visit.   Review of Systems  Constitutional:  Negative for activity change, appetite change and unexpected weight change.  Respiratory: Negative.    Cardiovascular: Negative.   Gastrointestinal: Negative.   Endocrine: Negative for cold intolerance and heat intolerance.  Genitourinary: Negative.   Musculoskeletal: Negative.   Skin: Negative.   Neurological: Negative.   Hematological: Negative.   Psychiatric/Behavioral:  Negative for behavioral problems, decreased concentration, dysphoric mood,  hallucinations, self-injury, sleep disturbance and suicidal ideas. The patient is not nervous/anxious.    Last CBC Lab Results  Component Value Date   WBC 5.0 12/24/2021   HGB 14.1 12/24/2021   HCT 42.1 12/24/2021   MCV 88.4 12/24/2021   MCH 28.9 10/11/2020   RDW 13.2 12/24/2021   PLT 241.0 12/24/2021   Last metabolic panel Lab Results  Component Value Date   GLUCOSE 83 02/26/2023   NA 140 02/26/2023   K 4.2 02/26/2023   CL 108 02/26/2023   CO2 23 02/26/2023   BUN 11 02/26/2023   CREATININE 0.70 02/26/2023   GFR 111.52 02/26/2023   CALCIUM  9.1 02/26/2023  PROT 6.6 02/26/2023   ALBUMIN 4.7 11/02/2023   BILITOT 0.5 02/26/2023   ALKPHOS 51 11/02/2023   AST 21 11/02/2023   ALT 46 (A) 11/02/2023   ANIONGAP 5 07/31/2020   Last lipids Lab Results  Component Value Date   CHOL 205 (A) 11/02/2023   HDL 91 (A) 11/02/2023   LDLCALC 103 11/02/2023   TRIG 55 11/02/2023   CHOLHDL 2 02/26/2023   Last hemoglobin A1c Lab Results  Component Value Date   HGBA1C 4.9 11/02/2023      Objective:  BP 128/78   Pulse 92   Temp 98.5 F (36.9 C) (Temporal)   Ht 5' 3.5" (1.613 m)   Wt 119 lb 3.2 oz (54.1 kg)   SpO2 98%   BMI 20.78 kg/m     Physical Exam Vitals and nursing note reviewed.  Constitutional:      General: She is not in acute distress. HENT:     Right Ear: Tympanic membrane, ear canal and external ear normal.     Left Ear: Tympanic membrane, ear canal and external ear normal.     Nose: Nose normal.  Eyes:     Extraocular Movements: Extraocular movements intact.     Conjunctiva/sclera: Conjunctivae normal.     Pupils: Pupils are equal, round, and reactive to light.  Neck:     Thyroid : No thyroid  mass, thyromegaly or thyroid  tenderness.  Cardiovascular:     Rate and Rhythm: Normal rate and regular rhythm.     Pulses: Normal pulses.     Heart sounds: Normal heart sounds.  Pulmonary:     Effort: Pulmonary effort is normal.     Breath sounds: Normal breath  sounds.  Abdominal:     General: Bowel sounds are normal.     Palpations: Abdomen is soft.  Musculoskeletal:        General: Normal range of motion.     Cervical back: Normal range of motion and neck supple.     Right lower leg: No edema.     Left lower leg: No edema.  Lymphadenopathy:     Cervical: No cervical adenopathy.  Skin:    General: Skin is warm and dry.  Neurological:     Mental Status: She is alert and oriented to person, place, and time.     Cranial Nerves: No cranial nerve deficit.  Psychiatric:        Mood and Affect: Mood normal.        Behavior: Behavior normal.        Thought Content: Thought content normal.      No results found for any visits on 04/05/24.    Assessment & Plan:    Routine Health Maintenance and Physical Exam  Immunization History  Administered Date(s) Administered   Influenza,inj,Quad PF,6+ Mos 08/24/2018   Influenza-Unspecified 08/25/2017, 09/03/2019, 09/06/2020, 09/13/2021, 09/23/2022, 09/09/2023   PFIZER(Purple Top)SARS-COV-2 Vaccination 12/19/2019, 01/08/2020, 09/06/2020   Pfizer(Comirnaty )Fall Seasonal Vaccine 12 years and older 09/14/2023   Tdap 10/11/2014, 05/15/2020   Health Maintenance  Topic Date Due   Cervical Cancer Screening (HPV/Pap Cotest)  03/28/2024   INFLUENZA VACCINE  07/14/2024   DTaP/Tdap/Td (3 - Td or Tdap) 05/15/2030   COVID-19 Vaccine  Completed   Hepatitis C Screening  Completed   HIV Screening  Completed   HPV VACCINES  Aged Out   Meningococcal B Vaccine  Aged Out   Discussed health benefits of physical activity, and encouraged her to engage in regular exercise appropriate for her age and condition.  Also encouraged to maintain heart healthy diet to help improve lipid panel.  Problem List Items Addressed This Visit   None Visit Diagnoses       Encounter for preventative adult health care exam with abnormal findings    -  Primary      Return in about 1 year (around 04/05/2025) for CPE (fasting).      Kathrene Parents, NP

## 2024-04-13 ENCOUNTER — Ambulatory Visit
Admission: RE | Admit: 2024-04-13 | Discharge: 2024-04-13 | Disposition: A | Discharge status: Home / Self Care | Source: Ambulatory Visit | Attending: Nurse Practitioner | Admitting: Nurse Practitioner

## 2024-04-13 DIAGNOSIS — R945 Abnormal results of liver function studies: Secondary | ICD-10-CM | POA: Diagnosis not present

## 2024-04-13 DIAGNOSIS — R7401 Elevation of levels of liver transaminase levels: Secondary | ICD-10-CM

## 2024-04-26 ENCOUNTER — Encounter: Payer: Self-pay | Admitting: Nurse Practitioner

## 2024-04-26 DIAGNOSIS — R7401 Elevation of levels of liver transaminase levels: Secondary | ICD-10-CM

## 2024-05-09 ENCOUNTER — Ambulatory Visit: Payer: Self-pay | Admitting: Nurse Practitioner

## 2024-05-09 ENCOUNTER — Other Ambulatory Visit (INDEPENDENT_AMBULATORY_CARE_PROVIDER_SITE_OTHER)

## 2024-05-09 DIAGNOSIS — R7401 Elevation of levels of liver transaminase levels: Secondary | ICD-10-CM

## 2024-05-09 LAB — HEPATIC FUNCTION PANEL
ALT: 55 U/L — ABNORMAL HIGH (ref 0–35)
AST: 24 U/L (ref 0–37)
Albumin: 4.5 g/dL (ref 3.5–5.2)
Alkaline Phosphatase: 44 U/L (ref 39–117)
Bilirubin, Direct: 0.1 mg/dL (ref 0.0–0.3)
Total Bilirubin: 0.7 mg/dL (ref 0.2–1.2)
Total Protein: 7.1 g/dL (ref 6.0–8.3)

## 2024-06-15 DIAGNOSIS — Z1151 Encounter for screening for human papillomavirus (HPV): Secondary | ICD-10-CM | POA: Diagnosis not present

## 2024-06-15 DIAGNOSIS — Z124 Encounter for screening for malignant neoplasm of cervix: Secondary | ICD-10-CM | POA: Diagnosis not present

## 2024-06-15 DIAGNOSIS — Z6821 Body mass index (BMI) 21.0-21.9, adult: Secondary | ICD-10-CM | POA: Diagnosis not present

## 2024-06-15 DIAGNOSIS — Z01419 Encounter for gynecological examination (general) (routine) without abnormal findings: Secondary | ICD-10-CM | POA: Diagnosis not present

## 2024-06-15 DIAGNOSIS — N841 Polyp of cervix uteri: Secondary | ICD-10-CM | POA: Diagnosis not present

## 2024-08-02 DIAGNOSIS — L821 Other seborrheic keratosis: Secondary | ICD-10-CM | POA: Diagnosis not present

## 2024-08-02 DIAGNOSIS — D2272 Melanocytic nevi of left lower limb, including hip: Secondary | ICD-10-CM | POA: Diagnosis not present

## 2024-08-02 DIAGNOSIS — D224 Melanocytic nevi of scalp and neck: Secondary | ICD-10-CM | POA: Diagnosis not present

## 2024-08-02 DIAGNOSIS — D2262 Melanocytic nevi of left upper limb, including shoulder: Secondary | ICD-10-CM | POA: Diagnosis not present

## 2024-08-02 DIAGNOSIS — D2261 Melanocytic nevi of right upper limb, including shoulder: Secondary | ICD-10-CM | POA: Diagnosis not present

## 2024-08-02 DIAGNOSIS — D2239 Melanocytic nevi of other parts of face: Secondary | ICD-10-CM | POA: Diagnosis not present

## 2024-08-02 DIAGNOSIS — D225 Melanocytic nevi of trunk: Secondary | ICD-10-CM | POA: Diagnosis not present

## 2024-09-13 ENCOUNTER — Other Ambulatory Visit (HOSPITAL_COMMUNITY): Payer: Self-pay | Admitting: Nurse Practitioner

## 2024-09-13 DIAGNOSIS — R932 Abnormal findings on diagnostic imaging of liver and biliary tract: Secondary | ICD-10-CM

## 2024-09-27 ENCOUNTER — Other Ambulatory Visit (HOSPITAL_COMMUNITY): Payer: Self-pay

## 2024-09-27 MED ORDER — COVID-19 MRNA VAC-TRIS(PFIZER) 30 MCG/0.3ML IM SUSY
0.3000 mL | PREFILLED_SYRINGE | Freq: Once | INTRAMUSCULAR | 0 refills | Status: AC
  Filled 2024-09-27: qty 0.3, 1d supply, fill #0

## 2024-10-19 ENCOUNTER — Ambulatory Visit (HOSPITAL_COMMUNITY)
Admission: RE | Admit: 2024-10-19 | Discharge: 2024-10-19 | Disposition: A | Discharge status: Home / Self Care | Source: Ambulatory Visit | Attending: Nurse Practitioner | Admitting: Nurse Practitioner

## 2024-10-19 DIAGNOSIS — D1803 Hemangioma of intra-abdominal structures: Secondary | ICD-10-CM | POA: Diagnosis not present

## 2024-10-19 DIAGNOSIS — R7401 Elevation of levels of liver transaminase levels: Secondary | ICD-10-CM | POA: Diagnosis not present

## 2024-10-19 DIAGNOSIS — R932 Abnormal findings on diagnostic imaging of liver and biliary tract: Secondary | ICD-10-CM | POA: Insufficient documentation

## 2024-10-19 DIAGNOSIS — R160 Hepatomegaly, not elsewhere classified: Secondary | ICD-10-CM | POA: Diagnosis not present

## 2024-10-19 DIAGNOSIS — Z9049 Acquired absence of other specified parts of digestive tract: Secondary | ICD-10-CM | POA: Diagnosis not present

## 2024-10-30 ENCOUNTER — Other Ambulatory Visit: Payer: Self-pay | Admitting: Nurse Practitioner

## 2024-10-30 DIAGNOSIS — D376 Neoplasm of uncertain behavior of liver, gallbladder and bile ducts: Secondary | ICD-10-CM

## 2024-12-21 ENCOUNTER — Ambulatory Visit
Admission: RE | Admit: 2024-12-21 | Discharge: 2024-12-21 | Disposition: A | Discharge status: Home / Self Care | Source: Ambulatory Visit | Attending: Nurse Practitioner | Admitting: Nurse Practitioner

## 2024-12-21 DIAGNOSIS — D376 Neoplasm of uncertain behavior of liver, gallbladder and bile ducts: Secondary | ICD-10-CM

## 2024-12-21 MED ORDER — GADOPICLENOL 0.5 MMOL/ML IV SOLN
5.0000 mL | Freq: Once | INTRAVENOUS | Status: AC | PRN
  Administered 2024-12-21: 5 mL via INTRAVENOUS
